# Patient Record
Sex: Female | Born: 1950 | Race: White | Hispanic: No | Marital: Married | Smoking: Former smoker
Health system: Southern US, Community
[De-identification: ages and names within clinical notes are randomized; demographics above are authoritative.]

---

## 2000-01-11 ENCOUNTER — Other Ambulatory Visit: Admission: RE | Admit: 2000-01-11 | Discharge: 2000-01-11 | Payer: Self-pay | Admitting: Family Medicine

## 2000-01-17 ENCOUNTER — Encounter: Payer: Self-pay | Admitting: Family Medicine

## 2000-01-17 ENCOUNTER — Encounter: Admission: RE | Admit: 2000-01-17 | Discharge: 2000-01-17 | Payer: Self-pay | Admitting: Family Medicine

## 2001-04-24 ENCOUNTER — Other Ambulatory Visit: Admission: RE | Admit: 2001-04-24 | Discharge: 2001-04-24 | Payer: Self-pay | Admitting: Family Medicine

## 2003-05-05 ENCOUNTER — Other Ambulatory Visit: Admission: RE | Admit: 2003-05-05 | Discharge: 2003-05-05 | Payer: Self-pay | Admitting: Family Medicine

## 2003-07-25 ENCOUNTER — Ambulatory Visit (HOSPITAL_COMMUNITY): Admission: RE | Admit: 2003-07-25 | Discharge: 2003-07-25 | Payer: Self-pay | Admitting: *Deleted

## 2003-07-25 ENCOUNTER — Encounter (INDEPENDENT_AMBULATORY_CARE_PROVIDER_SITE_OTHER): Payer: Self-pay | Admitting: *Deleted

## 2004-05-17 ENCOUNTER — Other Ambulatory Visit: Admission: RE | Admit: 2004-05-17 | Discharge: 2004-05-17 | Payer: Self-pay | Admitting: Family Medicine

## 2005-02-27 ENCOUNTER — Encounter: Admission: RE | Admit: 2005-02-27 | Discharge: 2005-02-27 | Payer: Self-pay | Admitting: Family Medicine

## 2005-05-20 ENCOUNTER — Other Ambulatory Visit: Admission: RE | Admit: 2005-05-20 | Discharge: 2005-05-20 | Payer: Self-pay | Admitting: Family Medicine

## 2006-07-31 ENCOUNTER — Other Ambulatory Visit: Admission: RE | Admit: 2006-07-31 | Discharge: 2006-07-31 | Payer: Self-pay | Admitting: Family Medicine

## 2008-06-22 ENCOUNTER — Other Ambulatory Visit: Admission: RE | Admit: 2008-06-22 | Discharge: 2008-06-22 | Payer: Self-pay | Admitting: Family Medicine

## 2009-07-18 ENCOUNTER — Other Ambulatory Visit: Admission: RE | Admit: 2009-07-18 | Discharge: 2009-07-18 | Payer: Self-pay | Admitting: Family Medicine

## 2012-08-31 ENCOUNTER — Other Ambulatory Visit: Payer: Self-pay | Admitting: Family Medicine

## 2012-08-31 ENCOUNTER — Other Ambulatory Visit (HOSPITAL_COMMUNITY)
Admission: RE | Admit: 2012-08-31 | Discharge: 2012-08-31 | Disposition: A | Discharge status: Home / Self Care | Payer: PRIVATE HEALTH INSURANCE | Source: Ambulatory Visit | Attending: Family Medicine | Admitting: Family Medicine

## 2012-08-31 DIAGNOSIS — Z124 Encounter for screening for malignant neoplasm of cervix: Secondary | ICD-10-CM | POA: Insufficient documentation

## 2012-08-31 DIAGNOSIS — Z1151 Encounter for screening for human papillomavirus (HPV): Secondary | ICD-10-CM | POA: Insufficient documentation

## 2013-09-08 ENCOUNTER — Other Ambulatory Visit: Payer: Self-pay | Admitting: Family Medicine

## 2013-09-08 DIAGNOSIS — R109 Unspecified abdominal pain: Secondary | ICD-10-CM

## 2013-09-16 ENCOUNTER — Ambulatory Visit
Admission: RE | Admit: 2013-09-16 | Discharge: 2013-09-16 | Disposition: A | Discharge status: Home / Self Care | Payer: PRIVATE HEALTH INSURANCE | Source: Ambulatory Visit | Attending: Family Medicine | Admitting: Family Medicine

## 2013-09-16 DIAGNOSIS — R109 Unspecified abdominal pain: Secondary | ICD-10-CM

## 2016-01-10 ENCOUNTER — Other Ambulatory Visit: Payer: Self-pay | Admitting: Family Medicine

## 2016-01-10 ENCOUNTER — Ambulatory Visit
Admission: RE | Admit: 2016-01-10 | Discharge: 2016-01-10 | Disposition: A | Discharge status: Home / Self Care | Payer: PRIVATE HEALTH INSURANCE | Source: Ambulatory Visit | Attending: Family Medicine | Admitting: Family Medicine

## 2016-01-10 DIAGNOSIS — M25551 Pain in right hip: Secondary | ICD-10-CM

## 2016-08-16 ENCOUNTER — Encounter: Payer: Self-pay | Admitting: Podiatry

## 2016-08-16 ENCOUNTER — Ambulatory Visit (INDEPENDENT_AMBULATORY_CARE_PROVIDER_SITE_OTHER): Payer: PRIVATE HEALTH INSURANCE | Admitting: Podiatry

## 2016-08-16 VITALS — BP 152/78 | HR 49 | Resp 16 | Ht 63.0 in | Wt 141.0 lb

## 2016-08-16 DIAGNOSIS — M216X9 Other acquired deformities of unspecified foot: Secondary | ICD-10-CM | POA: Diagnosis not present

## 2016-08-16 DIAGNOSIS — L84 Corns and callosities: Secondary | ICD-10-CM | POA: Diagnosis not present

## 2016-08-16 NOTE — Progress Notes (Signed)
   Subjective:    Patient ID: Pamela Nixon, female    DOB: 01/28/1951, 66 y.o.   MRN: [id number]  HPI Chief Complaint  Patient presents with  . Painful Lesion    Right Foot, plantar forefoot-below 5th toe; pt stated, "Has had pain since October 2017"      Review of Systems  Musculoskeletal: Positive for arthralgias.  All other systems reviewed and are negative.      Objective:   Physical Exam        Assessment & Plan:

## 2016-08-17 NOTE — Progress Notes (Signed)
Subjective:     Patient ID: Pamela BostonJudith A Nixon, female   DOB: 05/25/1951, 66 y.o.   MRN: [id number]  HPI patient presents with painful lesion right foot underneath the fourth and fifth metatarsal and states she does not remember injury   Review of Systems  All other systems reviewed and are negative.      Objective:   Physical Exam  Constitutional: She is oriented to person, place, and time.  Cardiovascular: Intact distal pulses.   Musculoskeletal: Normal range of motion.  Neurological: She is oriented to person, place, and time.  Skin: Skin is warm.  Nursing note and vitals reviewed.  neurovascular status intact muscle strength adequate range of motion with patient found to have keratotic lesion sub-fourth metatarsal right that upon debridement shows a lucent core. It is painful when pressed with direct palpation and patient's noted to have good digital perfusion well oriented 3     Assessment:     Inflammatory keratotic lesion right with porokeratotic base    Plan:     H&P condition reviewed debridement accomplished with no iatrogenic bleeding and reappoint when symptomatically

## 2018-01-02 ENCOUNTER — Other Ambulatory Visit (HOSPITAL_COMMUNITY)
Admission: RE | Admit: 2018-01-02 | Discharge: 2018-01-02 | Disposition: A | Discharge status: Home / Self Care | Payer: Commercial Managed Care - PPO | Source: Ambulatory Visit | Attending: Family Medicine | Admitting: Family Medicine

## 2018-01-02 ENCOUNTER — Other Ambulatory Visit: Payer: Self-pay | Admitting: Family Medicine

## 2018-01-02 DIAGNOSIS — Z124 Encounter for screening for malignant neoplasm of cervix: Secondary | ICD-10-CM | POA: Diagnosis not present

## 2018-01-06 LAB — CYTOLOGY - PAP
DIAGNOSIS: NEGATIVE
HPV: NOT DETECTED

## 2019-03-01 ENCOUNTER — Other Ambulatory Visit: Payer: Self-pay

## 2019-03-01 DIAGNOSIS — Z20822 Contact with and (suspected) exposure to covid-19: Secondary | ICD-10-CM

## 2019-03-03 LAB — NOVEL CORONAVIRUS, NAA: SARS-CoV-2, NAA: NOT DETECTED

## 2019-04-16 ENCOUNTER — Other Ambulatory Visit: Payer: Self-pay

## 2019-04-16 DIAGNOSIS — Z20822 Contact with and (suspected) exposure to covid-19: Secondary | ICD-10-CM

## 2019-04-17 LAB — NOVEL CORONAVIRUS, NAA: SARS-CoV-2, NAA: NOT DETECTED

## 2019-07-30 ENCOUNTER — Other Ambulatory Visit: Payer: PRIVATE HEALTH INSURANCE

## 2019-08-06 ENCOUNTER — Ambulatory Visit: Payer: PRIVATE HEALTH INSURANCE | Attending: Internal Medicine

## 2019-08-06 DIAGNOSIS — Z23 Encounter for immunization: Secondary | ICD-10-CM | POA: Insufficient documentation

## 2019-08-06 NOTE — Progress Notes (Signed)
   Covid-19 Vaccination Clinic  Name:  TASHIMA SCARPULLA    MRN: [id number] DOB: 1950/08/01  08/06/2019  Ms. Simerly was observed post Covid-19 immunization for 15 minutes without incidence. She was provided with Vaccine Information Sheet and instruction to access the V-Safe system.   Ms. Krupka was instructed to call 911 with any severe reactions post vaccine: Marland Kitchen Difficulty breathing  . Swelling of your face and throat  . A fast heartbeat  . A bad rash all over your body  . Dizziness and weakness    Immunizations Administered    Name Date Dose VIS Date Route   Pfizer COVID-19 Vaccine 08/06/2019  4:33 PM 0.3 mL 06/25/2019 Intramuscular   Manufacturer: [employer], [employer]   Lot: [id number]   NDC: [address]-1

## 2019-08-27 ENCOUNTER — Other Ambulatory Visit: Payer: Self-pay

## 2019-08-27 ENCOUNTER — Ambulatory Visit: Payer: PRIVATE HEALTH INSURANCE | Attending: Internal Medicine

## 2019-08-27 DIAGNOSIS — Z23 Encounter for immunization: Secondary | ICD-10-CM

## 2019-08-27 NOTE — Progress Notes (Signed)
   Covid-19 Vaccination Clinic  Name:  Pamela Nixon    MRN: [id number] DOB: [birth year]  08/27/2019  Pamela Nixon was observed post Covid-19 immunization for 30 minutes based on pre-vaccination screening without incidence. She was provided with Vaccine Information Sheet and instruction to access the V-Safe system.   Pamela Nixon was instructed to call 911 with any severe reactions post vaccine: Marland Kitchen Difficulty breathing  . Swelling of your face and throat  . A fast heartbeat  . A bad rash all over your body  . Dizziness and weakness    Immunizations Administered    Name Date Dose VIS Date Route   Pfizer COVID-19 Vaccine 08/27/2019  3:18 PM 0.3 mL 06/25/2019 Intramuscular   Manufacturer: [employer], [employer]   Lot: [id number]   NDC: [address]-2

## 2019-09-13 ENCOUNTER — Ambulatory Visit: Payer: PRIVATE HEALTH INSURANCE | Attending: Internal Medicine

## 2019-09-13 DIAGNOSIS — Z20822 Contact with and (suspected) exposure to covid-19: Secondary | ICD-10-CM

## 2019-09-14 LAB — NOVEL CORONAVIRUS, NAA: SARS-CoV-2, NAA: NOT DETECTED

## 2021-02-01 ENCOUNTER — Other Ambulatory Visit: Payer: Self-pay | Admitting: Family Medicine

## 2021-02-01 DIAGNOSIS — R1031 Right lower quadrant pain: Secondary | ICD-10-CM

## 2021-02-22 ENCOUNTER — Ambulatory Visit
Admission: RE | Admit: 2021-02-22 | Discharge: 2021-02-22 | Disposition: A | Discharge status: Home / Self Care | Payer: Medicare Other | Source: Ambulatory Visit | Attending: Family Medicine | Admitting: Family Medicine

## 2021-02-22 DIAGNOSIS — R1031 Right lower quadrant pain: Secondary | ICD-10-CM

## 2021-02-22 MED ORDER — IOPAMIDOL (ISOVUE-370) INJECTION 76%
80.0000 mL | Freq: Once | INTRAVENOUS | Status: AC | PRN
  Administered 2021-02-22: 80 mL via INTRAVENOUS

## 2021-08-29 ENCOUNTER — Encounter: Payer: Self-pay | Admitting: Internal Medicine

## 2021-08-29 ENCOUNTER — Ambulatory Visit (INDEPENDENT_AMBULATORY_CARE_PROVIDER_SITE_OTHER): Payer: Medicare Other | Admitting: Internal Medicine

## 2021-08-29 ENCOUNTER — Other Ambulatory Visit: Payer: Self-pay

## 2021-08-29 VITALS — BP 162/90 | HR 50 | Wt 145.8 lb

## 2021-08-29 DIAGNOSIS — R079 Chest pain, unspecified: Secondary | ICD-10-CM

## 2021-08-29 NOTE — Progress Notes (Signed)
Cardiology Office Note   Date:  08/29/2021   ID:  Pamela Nixon, DOB [birth year], MRN [id number]  PCP:  Lupita Raider, MD  Cardiologist:   Dietrich Pates, MD   Patient self referred for evaluation of chest pressure    History of Present Illness: Pamela Nixon is a 71 y.o. female with no prior cardiac Hx   Presents for evaluation of chest pressure    The pt says chest pressure is not associated with activity  First occurred on Jan 27   Wok up in AM with it   Striking  but not too severe  Pain started in mid line   Spread to both sides, between Shoulder blades    No antirior chest pain   Spells last about 10 to 15 min   The following Saturday it happened again.    Decided against going to hospital    not positional   Lasted 20 to 30 min   Gone all day Sunday   ? Angina     Monday 1/30   Morning flight    Felt OK The pt says that with activities she feels fine  No CP or back pain   She has been very busy watching grand children No pain with walking or going up/down stairs   No presyncope or syncope   Wed 8th symptoms of cold   ? Grandkids    Got cold Cold improved  Yesterday  138/78       13 0/      Current Meds  Medication Sig   doxylamine, Sleep, (UNISOM) 25 MG tablet Take 25 mg by mouth at bedtime as needed.     Allergies:   Patient has no known allergies.   No past medical history on file.  No past surgical history on file.   Social History:  The patient  reports that she has quit smoking. She has never used smokeless tobacco.   Family History:  The patient's family history is not on file.  Dad:   64 had CABG    Had 2     Paternal dad died of MI at 15    Paternal uncles  not as bad Mom:   BP , afib     Artiritis   93     4 bros  BP meds     2 sisters   BP meds   ROS:  Please see the history of present illness. All other systems are reviewed and  Negative to the above problem except as noted.    PHYSICAL EXAM: VS:  BP (!) 162/90 (BP Location: Right Arm,  Patient Position: Sitting, Cuff Size: Normal)    Pulse (!) 50    Wt 145 lb 12.8 oz (66.1 kg)    BMI 25.83 kg/m   GEN: Well nourished, well developed, in no acute distress  HEENT: normal  Neck: no JVD, carotid bruits, Cardiac: RRR; no murmurs  No LE  edema 2+ pulses  Respiratory:  clear to auscultation bilaterally, GI: soft, nontender, nondistended, + BS  No hepatomegaly  MS: no deformity Moving all extremities   Skin: warm and dry, no rash Neuro:  Strength and sensation are intact Psych: euthymic mood, full affect   EKG:  EKG is ordered today.  Sinus bradycardia   47 bpm  LVH     Lipid Panel No results found for: CHOL, TRIG, HDL, CHOLHDL, VLDL, LDLCALC, LDLDIRECT    Wt Readings from Last 3 Encounters:  08/29/21 145 lb  12.8 oz (66.1 kg)  08/16/16 141 lb (64 kg)      ASSESSMENT AND PLAN:  1  Chest / back discomfort    Very atypical for coronary ischemia/angina   ? Muscular   ? GI   (patient denies other symtpoms)   ? Pulmomary   ? Related to BP     Follow     Would get echo to evaluate LV LV anatomy     Also set up for Ca score CT to eval for coronary calcifications/plaque burden   Will also allow lungs / chest to be images   2  HTN   BP his high today   ? Due to anxiety   Check at home   Call  3  PAD  Patient has very mild atherosclerosis of distal aorta on CT   Neds tight control of liopids    Get labs  4  LVH   EKG sugg LVH   Will get echo to confirm  Get lipomed, Lpa, Apo B   Current medicines are reviewed at length with the patient today.  The patient does not have concerns regarding medicines.  Signed, Dietrich Pates, MD  08/29/2021 11:14 AM    [facility] [address] [place], [place], [place]  [address] Phone: [phone]; Fax: [phone]

## 2021-08-29 NOTE — Patient Instructions (Signed)
Medication Instructions:  Your physician recommends that you continue on your current medications as directed. Please refer to the Current Medication list given to you today.  *If you need a refill on your cardiac medications before your next appointment, please call your pharmacy*   Lab Work: LIPOMED TODAY  If you have labs (blood work) drawn today and your tests are completely normal, you will receive your results only by: MyChart Message (if you have MyChart) OR A paper copy in the mail If you have any lab test that is abnormal or we need to change your treatment, we will call you to review the results.   Testing/Procedures:  Cardiac Score   Your physician has requested that you have an echocardiogram. Echocardiography is a painless test that uses sound waves to create images of your heart. It provides your doctor with information about the size and shape of your heart and how well your hearts chambers and valves are working. This procedure takes approximately one hour. There are no restrictions for this procedure.    Follow-Up: At [facility], you and your health needs are our priority.  As part of our continuing mission to provide you with exceptional heart care, we have created designated Provider Care Teams.  These Care Teams include your primary Cardiologist (physician) and Advanced Practice Providers (APPs -  Physician Assistants and Nurse Practitioners) who all work together to provide you with the care you need, when you need it.  We recommend signing up for the patient portal called "MyChart".  Sign up information is provided on this After Visit Summary.  MyChart is used to connect with patients for Virtual Visits (Telemedicine).  Patients are able to view lab/test results, encounter notes, upcoming appointments, etc.  Non-urgent messages can be sent to your provider as well.   To learn more about what you can do with MyChart, go to [email or web address].   Coronary  Calcium Scan A coronary calcium scan is an imaging test used to look for deposits of plaque in the inner lining of the blood vessels of the heart (coronary arteries). Plaque is made up of calcium, protein, and fatty substances. These deposits of plaque can partly clog and narrow the coronary arteries without producing any symptoms or warning signs. This puts a person at risk for a heart attack. This test is recommended for people who are at moderate risk for heart disease. The test can find plaque deposits before symptoms develop. Tell a health care provider about: Any allergies you have. All medicines you are taking, including vitamins, herbs, eye drops, creams, and over-the-counter medicines. Any problems you or family members have had with anesthetic medicines. Any blood disorders you have. Any surgeries you have had. Any medical conditions you have. Whether you are pregnant or may be pregnant. What are the risks? Generally, this is a safe procedure. However, problems may occur, including: Harm to a pregnant woman and her unborn baby. This test involves the use of radiation. Radiation exposure can be dangerous to a pregnant woman and her unborn baby. If you are pregnant or think you may be pregnant, you should not have this procedure done. Slight increase in the risk of cancer. This is because of the radiation involved in the test. What happens before the procedure? Ask your health care provider for any specific instructions on how to prepare for this procedure. You may be asked to avoid products that contain caffeine, tobacco, or nicotine for 4 hours before the procedure. What happens during the  procedure?  You will undress and remove any jewelry from your neck or chest. You will put on a hospital gown. Sticky electrodes will be placed on your chest. The electrodes will be connected to an electrocardiogram (ECG) machine to record a tracing of the electrical activity of your heart. You will  lie down on a curved bed that is attached to the CT scanner. You may be given medicine to slow down your heart rate so that clear pictures can be created. You will be moved into the CT scanner, and the CT scanner will take pictures of your heart. During this time, you will be asked to lie still and hold your breath for 2-3 seconds at a time while each picture of your heart is being taken. The procedure may vary among health care providers and hospitals. What happens after the procedure? You can get dressed. You can return to your normal activities. It is up to you to get the results of your procedure. Ask your health care provider, or the department that is doing the procedure, when your results will be ready. Summary A coronary calcium scan is an imaging test used to look for deposits of plaque in the inner lining of the blood vessels of the heart (coronary arteries). Plaque is made up of calcium, protein, and fatty substances. Generally, this is a safe procedure. Tell your health care provider if you are pregnant or may be pregnant. Ask your health care provider for any specific instructions on how to prepare for this procedure. A CT scanner will take pictures of your heart. You can return to your normal activities after the scan is done. This information is not intended to replace advice given to you by your health care provider. Make sure you discuss any questions you have with your health care provider. Document Revised: 01/14/2019 Document Reviewed: 01/19/2019 Elsevier Patient Education  2022 [employer].

## 2021-08-30 LAB — APOLIPOPROTEIN B: Apolipoprotein B: 83 mg/dL (ref <90)

## 2021-08-30 LAB — NMR, LIPOPROFILE
Cholesterol, Total: 185 mg/dL (ref 100–199)
HDL Particle Number: 31.6 umol/L (ref >=30.5)
HDL-C: 74 mg/dL (ref >39)
LDL Particle Number: 928 nmol/L (ref <1000)
LDL Size: 21.2 nm (ref >20.5)
LDL-C (NIH Calc): 102 mg/dL — ABNORMAL HIGH (ref 0–99)
LP-IR Score: <25 (ref <=45)
Small LDL Particle Number: <90 nmol/L (ref <=527)
Triglycerides: 44 mg/dL (ref 0–149)

## 2021-08-30 LAB — LIPOPROTEIN A (LPA): Lipoprotein (a): 23.2 nmol/L (ref <75.0)

## 2021-09-06 ENCOUNTER — Ambulatory Visit (INDEPENDENT_AMBULATORY_CARE_PROVIDER_SITE_OTHER)
Admission: RE | Admit: 2021-09-06 | Discharge: 2021-09-06 | Disposition: A | Discharge status: Home / Self Care | Payer: Self-pay | Source: Ambulatory Visit | Attending: Internal Medicine | Admitting: Internal Medicine

## 2021-09-06 ENCOUNTER — Ambulatory Visit (HOSPITAL_COMMUNITY): Payer: Medicare Other | Attending: Cardiology

## 2021-09-06 ENCOUNTER — Other Ambulatory Visit: Payer: Self-pay

## 2021-09-06 DIAGNOSIS — R079 Chest pain, unspecified: Secondary | ICD-10-CM | POA: Insufficient documentation

## 2021-09-06 LAB — ECHOCARDIOGRAM COMPLETE
Area-P 1/2: 3.76 cm2
MV M vel: 4.99 m/s
MV Peak grad: 99.6 mmHg
S' Lateral: 2.9 cm

## 2021-09-11 ENCOUNTER — Telehealth: Payer: Self-pay

## 2021-09-11 DIAGNOSIS — M545 Low back pain, unspecified: Secondary | ICD-10-CM

## 2021-09-11 DIAGNOSIS — E7849 Other hyperlipidemia: Secondary | ICD-10-CM

## 2021-09-11 DIAGNOSIS — Z79899 Other long term (current) drug therapy: Secondary | ICD-10-CM

## 2021-09-11 DIAGNOSIS — R911 Solitary pulmonary nodule: Secondary | ICD-10-CM

## 2021-09-11 MED ORDER — TRIAMTERENE-HCTZ 37.5-25 MG PO TABS: ORAL_TABLET | ORAL | 3 refills | Status: DC

## 2021-09-11 NOTE — Telephone Encounter (Signed)
My Chart sent to the pt to follow up.... med list update, labs ordered and waiting for pt to reply with lab date.

## 2021-09-11 NOTE — Telephone Encounter (Signed)
-----   Message from Pricilla Riffle, MD sent at 09/09/2021 10:26 AM EST ----- Called pt to review test results Recomm:  Crestor 5 mg    I will call in to [facility] ([place]) 2.  Follow up 8 wks with lipomed with CMET in 8 wks   Please call her to sched 3   Will need f/u CT of chest to follow up on small nodule seen She sent in BP readings readings   Most very good  110s t o120s   Some 140, 150s    I have asked her to keep tabs on BP esp at other times of day I have called in Rx for Maxzide 37.5/25     Told her to take 1/2 as needed if BP high 150s and up     Will review needs but she will have on hand

## 2021-09-17 NOTE — Telephone Encounter (Signed)
Pt advised will order Chest Ct for the small nodule on her calcium score for one year.  ?

## 2021-09-17 NOTE — Addendum Note (Signed)
Addended by: Bertram Millard on: 09/17/2021 04:01 PM ? ? Modules accepted: Orders ? ?

## 2021-10-31 NOTE — Progress Notes (Signed)
?Terrilee Files D.O. ?[facility] ?[address] [place] [address] ?Phone: [phone] ?Subjective:   ?I, Wilford Grist, am serving as a scribe for Dr. Antoine Primas. ? ?This visit occurred during the SARS-CoV-2 public health emergency.  Safety protocols were in place, including screening questions prior to the visit, additional usage of staff PPE, and extensive cleaning of exam room while observing appropriate contact time as indicated for disinfecting solutions.  ? ? ?I'm seeing this patient by the request  of:  Lupita Raider, MD ? ?CC: Hip pain, knee pain and neck pain ? ?[id number]  ?Pamela Nixon is a 71 y.o. female coming in with complaint of thoracic spine and chest pain. This pain has resolved but she would wake up in mornings with pain. Saw cardiologist and was told that she needed further eval by ortho. History of ruptured disc at L3/L4 which first presented as pain in her hip.  ? ?Also c/o of L knee pain for past 10 years. Had scope at age 14. L hip pain over lateral aspect has also developed since knee pain has increased. Pain with stair climbing. Patient is doing some strength work which is helping her knee and hip pain. Walks 2-3 miles a day.  ?  ?Past medical history is significant for a right ovarian cyst.  This was last imaged in August 2022. ?No past medical history on file. ?No past surgical history on file. ?Social History  ? ?Socioeconomic History  ? Marital status: Married  ?  Spouse name: Not on file  ? Number of children: Not on file  ? Years of education: Not on file  ? Highest education level: Not on file  ?Occupational History  ? Not on file  ?Tobacco Use  ? Smoking status: Former  ? Smokeless tobacco: Never  ?Substance and Sexual Activity  ? Alcohol use: Not on file  ? Drug use: Not on file  ? Sexual activity: Not on file  ?Other Topics Concern  ? Not on file  ?Social History Narrative  ? Not on file  ? ?Social Determinants of Health  ? ?Financial Resource Strain: Not  on file  ?Food Insecurity: Not on file  ?Transportation Needs: Not on file  ?Physical Activity: Not on file  ?Stress: Not on file  ?Social Connections: Not on file  ? ?No Known Allergies ?No family history on file. ? ? ?Current Outpatient Medications (Cardiovascular):  ?  triamterene-hydrochlorothiazide (MAXZIDE-25) 37.5-25 MG tablet, Take one tablet as needed for BP 150 or higher. ? ? ? ? ?Current Outpatient Medications (Other):  ?  doxylamine, Sleep, (UNISOM) 25 MG tablet, Take 25 mg by mouth at bedtime as needed. ? ? ?Reviewed prior external information including notes and imaging from  ?primary care provider ?As well as notes that were available from care everywhere and other healthcare systems. ? ?Past medical history, social, surgical and family history all reviewed in electronic medical record.  No pertanent information unless stated regarding to the chief complaint.  ? ?Review of Systems: ? No headache, visual changes, nausea, vomiting, diarrhea, constipation, dizziness, abdominal pain, skin rash, fevers, chills, night sweats, weight loss, swollen lymph nodes, body aches, joint swelling, chest pain, shortness of breath, mood changes. POSITIVE muscle aches ? ?Objective  ?Blood pressure 122/70, pulse 67, height 5\' 3"  (1.6 m), weight 140 lb (63.5 kg), SpO2 98 %. ?  ?General: No apparent distress alert and oriented x3 mood and affect normal, dressed appropriately.  ?HEENT: Pupils equal, extraocular movements intact  ?  Respiratory: Patient's speak in full sentences and does not appear short of breath  ?Cardiovascular: No lower extremity edema, non tender, no erythema  ?Gait normal with good balance and coordination.  ?MSK: Left knee exam does show the patient has tenderness to palpation mostly over the medial joint line.  Mild varus deformity with some mild tibial rotation noted.  Patient's hip does have some very mild limitation with internal rotation but does have full external rotation.  Mild tenderness noted  over the greater trochanteric area.  Low back exam does have some loss of lordosis negative straight leg test though noted.  Neurovascular intact distally.   ? ?97110; 15 additional minutes spent for Therapeutic exercises as stated in above notes.  This included exercises focusing on stretching, strengthening, with significant focus on eccentric aspects.   Long term goals include an improvement in range of motion, strength, endurance as well as avoiding reinjury. Patient's frequency would include in 1-2 times a day, 3-5 times a week for a duration of 6-12 weeks. Reviewed anatomy using anatomical model and how PFS occurs. ? ?Given rehab exercises handout for VMO, hip abductors, core, entire kinetic chain including proprioception exercises. ? ?Could benefit from PT, regular exercise, upright biking, and a PFS knee brace to assist with tracking abnormalities. ? Proper technique shown and discussed handout in great detail with ATC.  All questions were discussed and answered.  ? ? ?  ?Impression and Recommendations:  ?  ? ?The above documentation has been reviewed and is accurate and complete Judi Saa, DO ? ? ? ?

## 2021-11-01 ENCOUNTER — Ambulatory Visit (INDEPENDENT_AMBULATORY_CARE_PROVIDER_SITE_OTHER): Payer: Medicare Other

## 2021-11-01 ENCOUNTER — Ambulatory Visit (INDEPENDENT_AMBULATORY_CARE_PROVIDER_SITE_OTHER): Payer: Medicare Other | Admitting: Family Medicine

## 2021-11-01 ENCOUNTER — Encounter: Payer: Self-pay | Admitting: Family Medicine

## 2021-11-01 VITALS — BP 122/70 | HR 67 | Ht 63.0 in | Wt 140.0 lb

## 2021-11-01 DIAGNOSIS — G8929 Other chronic pain: Secondary | ICD-10-CM | POA: Diagnosis not present

## 2021-11-01 DIAGNOSIS — M1712 Unilateral primary osteoarthritis, left knee: Secondary | ICD-10-CM | POA: Diagnosis not present

## 2021-11-01 DIAGNOSIS — M25562 Pain in left knee: Secondary | ICD-10-CM

## 2021-11-01 DIAGNOSIS — M545 Low back pain, unspecified: Secondary | ICD-10-CM

## 2021-11-01 DIAGNOSIS — M25552 Pain in left hip: Secondary | ICD-10-CM

## 2021-11-01 NOTE — Patient Instructions (Addendum)
Vit D 2000IU daily ?Tart cherry extract 1200mg  at night ?Exercises ?Ice 20 min  ?Visco and PRP are options ?See me in 6-8 weeks ? ? ? ?

## 2021-11-01 NOTE — Assessment & Plan Note (Signed)
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  These were all seen on x-rays today.  I believe though the patient is having more secondary to the knee pain and the arthritic changes seem to be worse tear with radicular potential symptoms of the lumbar spine also within the differential.  At this point we will give patient exercises to see if we can help with this.  Discussed potential bracing of the knee, icing regimen and over-the-counter medications.  Patient will come back again if worsening pain consider formal physical therapy and injections ?

## 2021-11-05 ENCOUNTER — Telehealth: Payer: Self-pay

## 2021-11-05 DIAGNOSIS — E559 Vitamin D deficiency, unspecified: Secondary | ICD-10-CM

## 2021-11-05 DIAGNOSIS — Z79899 Other long term (current) drug therapy: Secondary | ICD-10-CM

## 2021-11-05 NOTE — Telephone Encounter (Signed)
-----   Message from Pricilla Riffle, MD sent at 11/03/2021  9:33 AM EDT ----- ?When patient comes in for repeat lipomed alone add Vit D.  Looks like set for CMET as well ?

## 2021-11-05 NOTE — Telephone Encounter (Signed)
Labs added.

## 2021-11-06 ENCOUNTER — Other Ambulatory Visit: Payer: Medicare Other | Admitting: *Deleted

## 2021-11-06 DIAGNOSIS — E7849 Other hyperlipidemia: Secondary | ICD-10-CM

## 2021-11-06 DIAGNOSIS — Z79899 Other long term (current) drug therapy: Secondary | ICD-10-CM

## 2021-11-06 DIAGNOSIS — E559 Vitamin D deficiency, unspecified: Secondary | ICD-10-CM

## 2021-11-08 ENCOUNTER — Telehealth: Payer: Self-pay | Admitting: Internal Medicine

## 2021-11-08 NOTE — Telephone Encounter (Signed)
Patient sent a message via patient schedule stating "I'm wondering if the labwork for cholesterol was missed when I came in on 4/25. I see results for Metabolic panel and Vitamin D levels but not the lipid panel. Could you check that? My understanding was that we would need to follow up that test 6-8 weeks after starting Crestor. ?Please advise. Thank you" Please advise.  ?

## 2021-11-08 NOTE — Telephone Encounter (Signed)
Pt aware Lipomed profile has not been resulted at this time.  Checked with our lab who states it will probably be next week before results are available.  Pt states understanding and was grateful for the call back and information.  She had no further questions at the end of the call.   ?

## 2021-11-09 LAB — COMPREHENSIVE METABOLIC PANEL
ALT: 14 IU/L (ref 0–32)
AST: 19 IU/L (ref 0–40)
Albumin/Globulin Ratio: 1.9 (ref 1.2–2.2)
Albumin: 4.4 g/dL (ref 3.8–4.8)
Alkaline Phosphatase: 57 IU/L (ref 44–121)
BUN/Creatinine Ratio: 17 (ref 12–28)
BUN: 15 mg/dL (ref 8–27)
Bilirubin Total: 0.5 mg/dL (ref 0.0–1.2)
CO2: 26 mmol/L (ref 20–29)
Calcium: 9.5 mg/dL (ref 8.7–10.3)
Chloride: 101 mmol/L (ref 96–106)
Creatinine, Ser: 0.86 mg/dL (ref 0.57–1.00)
Globulin, Total: 2.3 g/dL (ref 1.5–4.5)
Glucose: 93 mg/dL (ref 70–99)
Potassium: 4.4 mmol/L (ref 3.5–5.2)
Sodium: 139 mmol/L (ref 134–144)
Total Protein: 6.7 g/dL (ref 6.0–8.5)
eGFR: 73 mL/min/{1.73_m2} (ref >59)

## 2021-11-09 LAB — NMR, LIPOPROFILE
Cholesterol, Total: 136 mg/dL (ref 100–199)
HDL Particle Number: 31.1 umol/L (ref >=30.5)
HDL-C: 73 mg/dL (ref >39)
LDL Particle Number: 372 nmol/L (ref <1000)
LDL Size: 21 nm (ref >20.5)
LDL-C (NIH Calc): 52 mg/dL (ref 0–99)
LP-IR Score: <25 (ref <=45)
Small LDL Particle Number: <90 nmol/L (ref <=527)
Triglycerides: 49 mg/dL (ref 0–149)

## 2021-11-09 LAB — VITAMIN D 25 HYDROXY (VIT D DEFICIENCY, FRACTURES): Vit D, 25-Hydroxy: 59.9 ng/mL (ref 30.0–100.0)

## 2021-11-20 NOTE — Progress Notes (Signed)
?Pamela Nixon D.O. ?[facility] ?[address] [place] [address] ?Phone: [phone] ?Subjective:   ?I, Pamela Nixon, am serving as a scribe for Dr. Antoine Primas. ? ?This visit occurred during the SARS-CoV-2 public health emergency.  Safety protocols were in place, including screening questions prior to the visit, additional usage of staff PPE, and extensive cleaning of exam room while observing appropriate contact time as indicated for disinfecting solutions.  ? ? ?I'm seeing this patient by the request  of:  Lupita Raider, MD ? ?CC: Left hip pain, low back pain ? ?[id number]  ?11/01/2021 ?Patient does have arthritic changes noted of the left knee.  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  These were all seen on x-rays today.  I believe though the patient is having more secondary to the knee pain and the arthritic changes seem to be worse tear with radicular potential symptoms of the lumbar spine also within the differential.  At this point we will give patient exercises to see if we can help with this.  Discussed potential bracing of the knee, icing regimen and over-the-counter medications.  Patient will come back again if worsening pain consider formal physical therapy and injections ? ?Updated 11/21/2021 ?Pamela Nixon is a 71 y.o. female coming in with complaint of L hip pain. Pain groin and lumbar spine. Pain is worse than last visit. Started one week ago. Believes that her pain increased after going on a long hike. Pain is worse when she stands. Taking 2 Aleve daily. Using heat as well and states that she can be nauseous from the pain. Did vomit and get the chills on Sunday.  ? ?Patient also did have symptoms of UTI last weekend and went into UC and was given cipro.  ? ? ?  ? ?No past medical history on file. ?No past surgical history on file. ?Social History  ? ?Socioeconomic History  ? Marital status:  Married  ?  Spouse name: Not on file  ? Number of children: Not on file  ? Years of education: Not on file  ? Highest education level: Not on file  ?Occupational History  ? Not on file  ?Tobacco Use  ? Smoking status: Former  ? Smokeless tobacco: Never  ?Substance and Sexual Activity  ? Alcohol use: Not on file  ? Drug use: Not on file  ? Sexual activity: Not on file  ?Other Topics Concern  ? Not on file  ?Social History Narrative  ? Not on file  ? ?Social Determinants of Health  ? ?Financial Resource Strain: Not on file  ?Food Insecurity: Not on file  ?Transportation Needs: Not on file  ?Physical Activity: Not on file  ?Stress: Not on file  ?Social Connections: Not on file  ? ?No Known Allergies ?No family history on file. ? ? ?Current Outpatient Medications (Cardiovascular):  ?  triamterene-hydrochlorothiazide (MAXZIDE-25) 37.5-25 MG tablet, Take one tablet as needed for BP 150 or higher. ? ? ? ? ?Current Outpatient Medications (Other):  ?  doxylamine, Sleep, (UNISOM) 25 MG tablet, Take 25 mg by mouth at bedtime as needed. ? ? ?Reviewed prior external information including notes and imaging from  ?primary care provider ?As well as notes that were available from care everywhere and other healthcare systems. ? ?Past medical history, social, surgical and family history all reviewed in electronic medical record.  No pertanent information unless stated regarding to the  chief complaint.  ? ?Review of Systems: ? No headache, visual changes,   diarrhea,  dizziness, abdominal pain, skin rash, fevers, chills, night sweats, weight loss, swollen lymph nodes, joint swelling, chest pain, shortness of breath, mood changes. POSITIVE muscle aches, body aches, nausea, constipation ? ?Objective  ?Blood pressure (!) 138/102, pulse 67, height 5\' 3"  (1.6 m), weight 138 lb (62.6 kg), SpO2 99 %. ?  ?General: No apparent distress alert and oriented x3 mood and affect normal, dressed appropriately.  ?HEENT: Pupils equal, extraocular  movements intact  ?Respiratory: Patient's speak in full sentences and does not appear short of breath  ?Cardiovascular: No lower extremity edema, non tender, no erythema  ?Gait antalgic favoring the left side of her back and hip ?Tender to palpation of the paraspinal musculature.  Patient does have more pain with resisted hip flexion.  Patient is tender to palpation in the gluteal area and on the back as well. ?Stomach exam shows some very mild discomfort in the left lower quadrant.  No masses appreciated.  Negative Valsalva. ?  ?Impression and Recommendations:  ?  ? ?The above documentation has been reviewed and is accurate and complete , DO ? ? ? ?

## 2021-11-21 ENCOUNTER — Encounter: Payer: Self-pay | Admitting: Family Medicine

## 2021-11-21 ENCOUNTER — Ambulatory Visit (INDEPENDENT_AMBULATORY_CARE_PROVIDER_SITE_OTHER): Payer: Medicare Other | Admitting: Family Medicine

## 2021-11-21 ENCOUNTER — Ambulatory Visit: Payer: Self-pay

## 2021-11-21 ENCOUNTER — Ambulatory Visit (INDEPENDENT_AMBULATORY_CARE_PROVIDER_SITE_OTHER): Payer: Medicare Other

## 2021-11-21 VITALS — BP 138/102 | HR 67 | Ht 63.0 in | Wt 138.0 lb

## 2021-11-21 DIAGNOSIS — M2550 Pain in unspecified joint: Secondary | ICD-10-CM | POA: Diagnosis not present

## 2021-11-21 DIAGNOSIS — R109 Unspecified abdominal pain: Secondary | ICD-10-CM

## 2021-11-21 DIAGNOSIS — M25552 Pain in left hip: Secondary | ICD-10-CM | POA: Insufficient documentation

## 2021-11-21 DIAGNOSIS — R1030 Lower abdominal pain, unspecified: Secondary | ICD-10-CM | POA: Diagnosis not present

## 2021-11-21 DIAGNOSIS — M25551 Pain in right hip: Secondary | ICD-10-CM | POA: Diagnosis not present

## 2021-11-21 LAB — CBC WITH DIFFERENTIAL/PLATELET
Basophils Absolute: 0.1 10*3/uL (ref 0.0–0.1)
Basophils Relative: 0.7 % (ref 0.0–3.0)
Eosinophils Absolute: 0.1 10*3/uL (ref 0.0–0.7)
Eosinophils Relative: 0.8 % (ref 0.0–5.0)
HCT: 34.4 % — ABNORMAL LOW (ref 36.0–46.0)
Hemoglobin: 11.4 g/dL — ABNORMAL LOW (ref 12.0–15.0)
Lymphocytes Relative: 25.8 % (ref 12.0–46.0)
Lymphs Abs: 2.3 10*3/uL (ref 0.7–4.0)
MCHC: 33.1 g/dL (ref 30.0–36.0)
MCV: 86.4 fl (ref 78.0–100.0)
Monocytes Absolute: 0.7 10*3/uL (ref 0.1–1.0)
Monocytes Relative: 8.2 % (ref 3.0–12.0)
Neutro Abs: 5.8 10*3/uL (ref 1.4–7.7)
Neutrophils Relative %: 64.5 % (ref 43.0–77.0)
Platelets: 312 10*3/uL (ref 150.0–400.0)
RBC: 3.98 Mil/uL (ref 3.87–5.11)
RDW: 15.2 % (ref 11.5–15.5)
WBC: 9 10*3/uL (ref 4.0–10.5)

## 2021-11-21 LAB — URINALYSIS, ROUTINE W REFLEX MICROSCOPIC
Bilirubin Urine: NEGATIVE
Hgb urine dipstick: NEGATIVE
Ketones, ur: NEGATIVE
Nitrite: NEGATIVE
RBC / HPF: NONE SEEN (ref 0–?)
Specific Gravity, Urine: 1.01 (ref 1.000–1.030)
Total Protein, Urine: NEGATIVE
Urine Glucose: NEGATIVE
Urobilinogen, UA: 0.2 (ref 0.0–1.0)
pH: 5.5 (ref 5.0–8.0)

## 2021-11-21 LAB — COMPREHENSIVE METABOLIC PANEL
ALT: 13 U/L (ref 0–35)
AST: 17 U/L (ref 0–37)
Albumin: 4.3 g/dL (ref 3.5–5.2)
Alkaline Phosphatase: 48 U/L (ref 39–117)
BUN: 20 mg/dL (ref 6–23)
CO2: 26 mEq/L (ref 19–32)
Calcium: 9.2 mg/dL (ref 8.4–10.5)
Chloride: 102 mEq/L (ref 96–112)
Creatinine, Ser: 0.89 mg/dL (ref 0.40–1.20)
GFR: 65.99 mL/min (ref >60.00)
Glucose, Bld: 106 mg/dL — ABNORMAL HIGH (ref 70–99)
Potassium: 4.3 mEq/L (ref 3.5–5.1)
Sodium: 137 mEq/L (ref 135–145)
Total Bilirubin: 0.5 mg/dL (ref 0.2–1.2)
Total Protein: 7.2 g/dL (ref 6.0–8.3)

## 2021-11-21 MED ORDER — KETOROLAC TROMETHAMINE 30 MG/ML IJ SOLN
30.0000 mg | Freq: Once | INTRAMUSCULAR | Status: AC
  Administered 2021-11-21: 30 mg via INTRAMUSCULAR

## 2021-11-21 MED ORDER — METHYLPREDNISOLONE ACETATE 40 MG/ML IJ SUSP
40.0000 mg | Freq: Once | INTRAMUSCULAR | Status: AC
  Administered 2021-11-21: 40 mg via INTRAMUSCULAR

## 2021-11-21 NOTE — Assessment & Plan Note (Signed)
Patient is having pain that seems to be out of proportion to the amount of palpation.  Patient is having difficulty with some little constipation as well.  Patient has some pain that seems to be more muscular but difficult to tell with patient groin pain as well.  Has had some lumbar issues in the past.  Given Toradol and Depo-Medrol today.  Due to patient having increasing discomfort and recent UTI MRI of the pelvis with and without contrast ordered today.  Patient has had a ovarian cyst that has been monitored.  Patient also has had chronic constipation and will check if there is anything such as diverticulosis that could be contributing as well.  Patient has had the UTI we will also rule out things such as urinary tract infections with possible kidney stone that could be playing a role. ? ?If all is negative we will treat more as a tendinitis of the hip flexor. ?Given some home exercises and icing regimen in the interim.  Patient knows of significant worsening pain especially if associated with any more nausea or vomiting to seek medical attention immediately ?

## 2021-11-21 NOTE — Patient Instructions (Addendum)
Injections in backside ?KUB and UA today ?MRI pelvis w and wo [id number] ?See me in 4 weeks ? ?

## 2021-12-06 ENCOUNTER — Ambulatory Visit
Admission: RE | Admit: 2021-12-06 | Discharge: 2021-12-06 | Disposition: A | Discharge status: Home / Self Care | Payer: Medicare Other | Source: Ambulatory Visit | Attending: Family Medicine | Admitting: Family Medicine

## 2021-12-06 DIAGNOSIS — R1030 Lower abdominal pain, unspecified: Secondary | ICD-10-CM

## 2021-12-06 MED ORDER — GADOBENATE DIMEGLUMINE 529 MG/ML IV SOLN
12.0000 mL | Freq: Once | INTRAVENOUS | Status: AC | PRN
  Administered 2021-12-06: 12 mL via INTRAVENOUS

## 2021-12-19 NOTE — Progress Notes (Signed)
[facility] [address] [place] [address] Phone: [phone] Subjective:   Bruce Donath, am serving as a scribe for Dr. Antoine Primas.   I'm seeing this patient by the request  of:  Lupita Raider, MD  CC: Knee pain, hip pain and back pain  CVE:[id number]  11/21/2021 Patient is having pain that seems to be out of proportion to the amount of palpation.  Patient is having difficulty with some little constipation as well.  Patient has some pain that seems to be more muscular but difficult to tell with patient groin pain as well.  Has had some lumbar issues in the past.  Given Toradol and Depo-Medrol today.  Due to patient having increasing discomfort and recent UTI MRI of the pelvis with and without contrast ordered today.  Patient has had a ovarian cyst that has been monitored.  Patient also has had chronic constipation and will check if there is anything such as diverticulosis that could be contributing as well.  Patient has had the UTI we will also rule out things such as urinary tract infections with possible kidney stone that could be playing a role.   If all is negative we will treat more as a tendinitis of the hip flexor. Given some home exercises and icing regimen in the interim.  Patient knows of significant worsening pain especially if associated with any more nausea or vomiting to seek medical attention immediately  Update 12/20/2021 NAKOTA ACKERT is a 71 y.o. female coming in with complaint of L hip pain. Patient states that her pain ahd dissipated.   MRI pelvis 12/06/2021 IMPRESSION: 1.  No evidence of stress fracture.   2. No evidence of sacroiliitis or appreciable osseous lesion. No periosteal reaction.   3. Mild bilateral hip osteoarthritis. No evidence of joint effusion.   4.  Muscles and soft tissues of the pelvis are within normal limits.   5. Cystic structure at the level of S2 measuring up to 2.8 cm, likely adnexal cysts,  unchanged.     No past medical history on file. No past surgical history on file. Social History   Socioeconomic History   Marital status: Married    Spouse name: Not on file   Number of children: Not on file   Years of education: Not on file   Highest education level: Not on file  Occupational History   Not on file  Tobacco Use   Smoking status: Former   Smokeless tobacco: Never  Substance and Sexual Activity   Alcohol use: Not on file   Drug use: Not on file   Sexual activity: Not on file  Other Topics Concern   Not on file  Social History Narrative   Not on file   Social Determinants of Health   Financial Resource Strain: Not on file  Food Insecurity: Not on file  Transportation Needs: Not on file  Physical Activity: Not on file  Stress: Not on file  Social Connections: Not on file   No Known Allergies No family history on file.   Current Outpatient Medications (Cardiovascular):    triamterene-hydrochlorothiazide (MAXZIDE-25) 37.5-25 MG tablet, Take one tablet as needed for BP 150 or higher.     Current Outpatient Medications (Other):    doxylamine, Sleep, (UNISOM) 25 MG tablet, Take 25 mg by mouth at bedtime as needed.    Review of Systems:  No headache, visual changes, nausea, vomiting, diarrhea, constipation, dizziness, abdominal pain, skin rash, fevers, chills, night  sweats, weight loss, swollen lymph nodes, body aches, joint swelling, chest pain, shortness of breath, mood changes. POSITIVE muscle aches but improved  Objective  Blood pressure (!) 162/92, pulse (!) 56, height 5\' 3"  (1.6 m), SpO2 98 %.   General: No apparent distress alert and oriented x3 mood and affect normal, dressed appropriately.  HEENT: Pupils equal, extraocular movements intact  Respiratory: Patient's speak in full sentences and does not appear short of breath  Cardiovascular: No lower extremity edema, non tender, no erythema  Gait normal with good balance and coordination.   MSK: Back exam does have some degenerative scoliosis noted.  Negative straight leg test.  The patient seems to be extremely more comfortable than previous exam. Very mild scoliosis in the thoracolumbar junction with sitting noted. Left knee does have some arthritic changes noted.  Mild crepitus with flexion and extension.  No significant instability noted   Impression and Recommendations:     The above documentation has been reviewed and is accurate and complete , DO

## 2021-12-20 ENCOUNTER — Ambulatory Visit (INDEPENDENT_AMBULATORY_CARE_PROVIDER_SITE_OTHER): Payer: Medicare Other | Admitting: Family Medicine

## 2021-12-20 ENCOUNTER — Ambulatory Visit: Payer: Medicare Other

## 2021-12-20 VITALS — BP 162/92 | HR 56 | Ht 63.0 in

## 2021-12-20 DIAGNOSIS — M1712 Unilateral primary osteoarthritis, left knee: Secondary | ICD-10-CM | POA: Diagnosis not present

## 2021-12-20 DIAGNOSIS — M4150 Other secondary scoliosis, site unspecified: Secondary | ICD-10-CM

## 2021-12-20 DIAGNOSIS — M545 Low back pain, unspecified: Secondary | ICD-10-CM | POA: Diagnosis not present

## 2021-12-20 DIAGNOSIS — M25552 Pain in left hip: Secondary | ICD-10-CM | POA: Diagnosis not present

## 2021-12-20 NOTE — Assessment & Plan Note (Addendum)
Degenerative scoliosis noted.  Discussed icing regimen and home exercises.Discussed which activities to do .  Sent to PT due to concerns over family history of degenerative scoliosis.  Encouraged vitamin D supplementation. Last vitamin D Lab Results  Component Value Date   VD25OH 59.9 11/06/2021

## 2021-12-20 NOTE — Assessment & Plan Note (Signed)
Arthritis of left knee.  Discussed different things such as the possibility of injections.  At this moment patient would like to hold off and see how she does.  Patient will follow-up with me again in 3 to 6 months if needed.  We will do formal physical therapy for the knee but also more for the lower back and balance and coordination.

## 2021-12-20 NOTE — Patient Instructions (Signed)
PT Drawbridge-lumbar strengthening You have all the tools  Write [place] if you need [place] See me if you need me

## 2022-01-22 ENCOUNTER — Encounter (HOSPITAL_BASED_OUTPATIENT_CLINIC_OR_DEPARTMENT_OTHER): Payer: Self-pay | Admitting: Physical Therapy

## 2022-01-22 ENCOUNTER — Ambulatory Visit (HOSPITAL_BASED_OUTPATIENT_CLINIC_OR_DEPARTMENT_OTHER): Payer: Medicare Other | Attending: Family Medicine | Admitting: Physical Therapy

## 2022-01-22 DIAGNOSIS — M6281 Muscle weakness (generalized): Secondary | ICD-10-CM

## 2022-01-22 DIAGNOSIS — M25552 Pain in left hip: Secondary | ICD-10-CM

## 2022-01-22 DIAGNOSIS — M5459 Other low back pain: Secondary | ICD-10-CM

## 2022-01-22 DIAGNOSIS — M25551 Pain in right hip: Secondary | ICD-10-CM | POA: Diagnosis present

## 2022-01-22 NOTE — Therapy (Incomplete)
OUTPATIENT PHYSICAL THERAPY THORACOLUMBAR EVALUATION   Patient Name: Pamela Nixon MRN: [id number] DOB:08-16-1950, 71 y.o., female Today's Date: 01/23/2022   PT End of Session - 01/22/22 1222     Visit Number 1    Number of Visits 4    Date for PT Re-Evaluation 02/19/22    Authorization Type MCR A and B    PT Start Time 1110    PT Stop Time 1209    PT Time Calculation (min) 59 min    Activity Tolerance Patient tolerated treatment well    Behavior During Therapy [facility] for tasks assessed/performed             History reviewed. No pertinent past medical history. History reviewed. No pertinent surgical history. Patient Active Problem List   Diagnosis Date Noted   Degenerative scoliosis 12/20/2021   Hip pain, left 11/21/2021   Arthritis of left knee 11/01/2021    REFERRING PROVIDER: Judi Saa, DO   REFERRING DIAG: [phone] (ICD-10-CM) - Left hip pain M54.50 (ICD-10-CM) - Lumbar spine pain   Rationale for Evaluation and Treatment Rehabilitation  THERAPY DIAG:  Pain in left hip  Other low back pain  Muscle weakness (generalized)  Pain in right hip  ONSET DATE: April 2023  SUBJECTIVE:                                                                                                                                                                                           SUBJECTIVE STATEMENT: Pt reports she had a ruptured disc in 2017 and had a discectomy at L3-4.  Pt reports having pain in thoracic region which wrapped around to her chest in February.  Pt saw MD who referred pt to cardiologist.  Pt saw cardiologist and states she did have some plaque build up.  Cardiologist didn't think pain was cardiac related.  Pt was referred to Dr. Antoine Primas.  Pt saw Dr. Katrinka Blazing and received exercises.  He ordered an x ray and MRI.  Pt had an UTI and was negative for kidney stones.  Pt states MD thought pain may be from muscle damage in anterior hip/groin.   MD note indicated pt has  degenerative scoliosis, very mild scoliosis in the thoracolumbar junction.  Pt denies having any thoracic pain currently.  Pt reports she went on a long hike in April and had increased L hip and groin pain.  She states her back doesn't really bother her.  She has occasional lumbar pain.  Pt received a Toradol and Depo-Medrol injection in May which improved sx's.  Pt states she slipped and fell on her buttocks last week when  descending stairs from a bed loft.  She thinks that may contribute to her pain today also.  Pt was unable to walk upstairs due to her L hip pain.  She states she can perform stairs fine now.  Pt ambulates 35-40 mins per day and feels better when she walks.  Pt was unable to perform walking program last week due to being out of town and she felt her pain coming back.  Pt states she is able to walk 2 miles without pain.  Pt states she danced 1x/wk (though not in summer) and has soreness after dancing.   Pt states her L knee is the worst. Pt states she has had L knee pain for 10 years.  She has arthritis in L knee.  MD note indicated formal physical therapy for the knee but also more for the lower back and balance and coordination    PERTINENT HISTORY:  L knee arthritis  PAIN:  Are you having pain? Yes NPRS:  Lumbar: 2/10 current, 2/10 worst, 0/10 best              L hip:  2/10 current, 4-5/10 worst, 0/10 best  Location: bilat lower lumbar and posterior hips today   PRECAUTIONS: Other: L knee arthritis  WEIGHT BEARING RESTRICTIONS No  FALLS:  Has patient fallen in last 6 months? Yes; 1  LIVING ENVIRONMENT: Lives with: lives with their spouse Lives in: 3 story home Stairs: yes Has following equipment at home: Single point cane and standard walker  OCCUPATION: pt is retired  PLOF: Independent; Pt was able to perform stairs and her normal functional mobility skills without back and lumbar pain.  She has L knee arthritis and has had chronic L knee pain.   PATIENT GOALS  improve core strength to protect her spine, be active   OBJECTIVE:   DIAGNOSTIC FINDINGS:  -MRI pelvis 12/06/2021 IMPRESSION: 1.  No evidence of stress fracture.   2. No evidence of sacroiliitis or appreciable osseous lesion. No periosteal reaction.   3. Mild bilateral hip osteoarthritis. No evidence of joint effusion.   4.  Muscles and soft tissues of the pelvis are within normal limits.   5. Cystic structure at the level of S2 measuring up to 2.8 cm, likely adnexal cysts, unchanged.  -Lumbar X rays:  right convex scoliosis centered at the thoracolumbar junction  IMPRESSION: 1. Progressive multilevel spondylosis and facet hypertrophy. No acute bony abnormality. 2. Right convex scoliosis.   PATIENT SURVEYS:  LEFS 72/80  SCREENING FOR RED FLAGS:  Spinal tumors: No Cauda equina syndrome: No Compression fracture: No Abdominal aneurysm: No  COGNITION:  Overall cognitive status: Within functional limits for tasks assessed       PALPATION: TTP:  Very tender to palpate at L lateral hip and ITB.  minimally tender on R ITB, none in lateral hip.   TP at L2, spot tender at R piriformis region    LUMBAR ROM:   Active  A/PROM  eval  Flexion WNL  Extension WNL  Right lateral flexion WFL  Left lateral flexion WFL  Right rotation WNL  Left rotation WNL   (Blank rows = not tested)  LOWER EXTREMITY MMT:     MMT Right eval Left eval  Hip flexion 5/5 5/5  Hip extension 5/5 4/5  Hip abduction 5/5 5/5  Hip adduction    Hip internal rotation    Hip external rotation 5/5 5/5  Knee flexion 5/5 tested in sitting 5/5 tested in sitting  Knee extension  4+/5 4+/5  Ankle dorsiflexion    Ankle plantarflexion    Ankle inversion    Ankle eversion     (Blank rows = not tested)  LOWER EXTREMITY MMT:    MMT Right eval Left eval  Hip flexion    Hip extension WNL WNL  Hip abduction WNL WNL  Hip adduction    Hip internal rotation    Hip external rotation    Knee flexion     Knee extension    Ankle dorsiflexion    Ankle plantarflexion    Ankle inversion    Ankle eversion     (Blank rows = not tested)  LUMBAR SPECIAL TESTS:  Straight leg raise test: Negative bilat   GAIT: Assistive device utilized: None Level of assistance: Complete Independence Comments: Pt ambulates with a normalized heel to toe gait without limping.     TODAY'S TREATMENT  Reviewed exercises from Dr. Katrinka Blazing and her current exercise routine.  Pt is not performing the exercises from Dr. Katrinka Blazing.  She is performing squats, lunges, bridges, q-ped birddogs, supine Y's, T's, W's, and L's.  She occasionally performs the plank and also biceps curls and kickbacks.  Educated pt in correct form with exercises and purpose of exercises.   PT instructed in STW to ITB with hand and tennis ball.  See below for pt education.    PATIENT EDUCATION:  Education details: rationale of exercises, objective findings, POC, HEP, dx, exercise form, and prognosis.  PT answered pt's questions.   Person educated: Patient Education method: Explanation, Demonstration, and Verbal cues Education comprehension: verbalized understanding, returned demonstration, and verbal cues required   HOME EXERCISE PROGRAM: Will give next visit. Pt has  current program.   ASSESSMENT:  CLINICAL IMPRESSION: Patient is a 70 y.o. female who was seen today for physical therapy evaluation and treatment for L hip pain and lumbar spine pain.  Pt is doing much better.  She reports improved sx's in May after receiving a hip injection.  She was having L hip pain which interfered with her daily mobility including stairs.  Pt states her back typically feels fine though she is having some lumbar pain today.  Pt typically has soreness with dancing though is not performing in the summer.  Pt performs a walking program and typically feels better with walking program.  Pt has a hx of L knee pain and arthritis and states her knee bothers her the most.   Pt has pain in bilat lower lumbar flanks and bilat posterior hips today.  Pt has weakness in L hip extension and minimal weakness though symmetrical in bilat quads.  She has a good home program which she consistently performs.  PT reviewed her current home program, answered questions, and educated pt in correct form.    OBJECTIVE IMPAIRMENTS decreased strength and pain.   ACTIVITY LIMITATIONS   PARTICIPATION LIMITATIONS:   PERSONAL FACTORS 1 comorbidity: L knee arthritis  are also affecting patient's functional outcome.   REHAB POTENTIAL: Good  CLINICAL DECISION MAKING: Stable/uncomplicated  EVALUATION COMPLEXITY: Low   GOALS:  SHORT TERM GOALS: Target date: 02/05/2022  Pt will tolerate core exercises without adverse effects for improved core stability and reduced risk of recurring pain with daily activities and daily mobility.  Baseline: Goal status: INITIAL  LONG TERM GOALS: Target date: 02/19/2022  Pt will demo improved L hip extension strength to 5/5 for improved performance of and tolerance with functional mobility. Baseline:  Goal status: INITIAL  2.  Pt will report continued  performance of stairs without hip or lumbar pain.  Baseline:  Goal status: INITIAL  3.  Pt will be independent with core stabilization HEP for improved core stability and reduce stress on lumbar with workout activities and functional mobility.  Baseline:  Goal status: INITIAL     PLAN: PT FREQUENCY: 1x/week-2x/wk this week and 1x/wk afterwards  PT DURATION: other: 3-4 weeks  PLANNED INTERVENTIONS: Therapeutic exercises, Therapeutic activity, Neuromuscular re-education, Patient/Family education, Joint mobilization, Stair training, Aquatic Therapy, Dry Needling, Electrical stimulation, Spinal mobilization, Cryotherapy, Moist heat, Taping, Ultrasound, Manual therapy, and Re-evaluation.  PLAN FOR NEXT SESSION: Cont with core strengthening for 2-3 visits.     Audie Clear III PT,  DPT 01/23/22 11:23 AM

## 2022-01-24 ENCOUNTER — Ambulatory Visit (HOSPITAL_BASED_OUTPATIENT_CLINIC_OR_DEPARTMENT_OTHER): Payer: Medicare Other | Admitting: Physical Therapy

## 2022-01-24 DIAGNOSIS — M25551 Pain in right hip: Secondary | ICD-10-CM

## 2022-01-24 DIAGNOSIS — M6281 Muscle weakness (generalized): Secondary | ICD-10-CM

## 2022-01-24 DIAGNOSIS — M25552 Pain in left hip: Secondary | ICD-10-CM | POA: Diagnosis not present

## 2022-01-24 DIAGNOSIS — M5459 Other low back pain: Secondary | ICD-10-CM

## 2022-01-24 NOTE — Therapy (Addendum)
OUTPATIENT PHYSICAL THERAPY TREATMENT NOTE   Patient Name: Pamela Nixon MRN: [id number] DOB:[birth year], 71 y.o., female Today's Date: 01/25/2022    END OF SESSION:   PT End of Session - 01/24/22 1155     Visit Number 2    Number of Visits 4    Date for PT Re-Evaluation 02/19/22    Authorization Type MCR A and B    PT Start Time 1105    PT Stop Time 1146    PT Time Calculation (min) 41 min    Activity Tolerance Patient tolerated treatment well    Behavior During Therapy [facility] for tasks assessed/performed             History reviewed. No pertinent past medical history. History reviewed. No pertinent surgical history. Patient Active Problem List   Diagnosis Date Noted   Degenerative scoliosis 12/20/2021   Hip pain, left 11/21/2021   Arthritis of left knee 11/01/2021    REFERRING PROVIDER: Judi Saa, DO    REFERRING DIAG: [phone] (ICD-10-CM) - Left hip pain M54.50 (ICD-10-CM) - Lumbar spine pain    Rationale for Evaluation and Treatment Rehabilitation   THERAPY DIAG:  Pain in left hip   Other low back pain   Muscle weakness (generalized)   Pain in right hip   ONSET DATE: April 2023   SUBJECTIVE:                                                                                                                                                                                            SUBJECTIVE STATEMENT: Pt denies any adverse effects after prior Rx.  Pt states she has been performing some self soft tissue work to ITB as instructed.  She is tender with the soft tissue work though tolerates it well.  Pt reports there was no clinic schedule availability next week and the following week she is on vacation.          PERTINENT HISTORY:  Lumbar discectomy L3-4 in 2017, egenerative scoliosis, very mild scoliosis in the thoracolumbar junction, L knee arthritis   PAIN:  Are you having pain? Yes NPRS:  Lumbar: 0/10 current, 2/10 worst, 0/10 best              L hip:   0/10 current, 4-5/10 worst, 0/10 best   L knee:  0/10  Location: bilat lower lumbar and posterior hips today     PRECAUTIONS: Other: L knee arthritis   WEIGHT BEARING RESTRICTIONS No   FALLS:  Has patient fallen in last 6 months? Yes; 1   LIVING ENVIRONMENT: Lives with: lives with their spouse Lives  in: 3 story home Stairs: yes Has following equipment at home: Single point cane and standard walker   OCCUPATION: pt is retired   PLOF: Independent; Pt was able to perform stairs and her normal functional mobility skills without back and lumbar pain.  She has L knee arthritis and has had chronic L knee pain.    PATIENT GOALS improve core strength to protect her spine, be active     OBJECTIVE:    DIAGNOSTIC FINDINGS:  -MRI pelvis 12/06/2021 IMPRESSION: 1.  No evidence of stress fracture.   2. No evidence of sacroiliitis or appreciable osseous lesion. No periosteal reaction.   3. Mild bilateral hip osteoarthritis. No evidence of joint effusion.   4.  Muscles and soft tissues of the pelvis are within normal limits.   5. Cystic structure at the level of S2 measuring up to 2.8 cm, likely adnexal cysts, unchanged.   -Lumbar X rays:  right convex scoliosis centered at the thoracolumbar junction  IMPRESSION: 1. Progressive multilevel spondylosis and facet hypertrophy. No acute bony abnormality. 2. Right convex scoliosis.         TODAY'S TREATMENT  PT educated pt in correct performance and palpation of TrA contraction.  Pt performed supine TrA contraction with 5 sec hold. Pt performed:  Supine alt UE/LE with TrA 2x10 reps  Supine bridge with TrA 2x10 reps  Supine bridge with clams with GTB 2x10 reps   Q-ped birddogs with TrA 2x10 reps  Squats with TrA 2x10 reps  Paloff Press with RTB with TrA 2x10 reps bilat Pt received a HEP handout and was educated in correct form and appropriate frequency.  Pt instructed she should not have pain with HEP.     PATIENT EDUCATION:   Education details: rationale of exercises, POC, HEP, dx, exercise form, and prognosis.  PT answered pt's questions.   Person educated: Patient Education method: Explanation, Demonstration, and Verbal cues, t/c's, handout Education comprehension: verbalized understanding, returned demonstration, and verbal and tactile cues required     HOME EXERCISE PROGRAM: Access Code: [email or web address] URL: [email or web address] Date: 01/24/2022 Prepared by: Aaron Edelman  Exercises - Supine Transversus Abdominis Bracing - Hands on Stomach  - 1 x daily - 7 x weekly - 2 sets - 10 reps - 5 seconds hold - Supine alternating UE/LE  - 1 x daily - 5-6 x weekly - 2 sets - 10 reps - Supine Bridge  - 1 x daily - 5 x weekly - 2 sets - 10 reps - Supine Bridge with Resistance Band  - 3 x weekly - 2 sets - 10 reps - Standing Anti-Rotation Press with Anchored Resistance  - 3-4 x weekly - 2 sets - 10 reps   ASSESSMENT:   CLINICAL IMPRESSION: PT established HEP today.  Pt was instructed in performing TrA contractions and engaging TrA with LE/core exercises to facilitate improved core stability/strength.  She performed exercises well with cuing and instruction in correct form.  PT gave pt a HEP handout and pt demonstrates good understanding.  She responded well to Rx having no pain after Rx.  She should benefit from continued skilled PT services.      OBJECTIVE IMPAIRMENTS decreased strength and pain.    ACTIVITY LIMITATIONS    PARTICIPATION LIMITATIONS:    PERSONAL FACTORS 1 comorbidity: L knee arthritis  are also affecting patient's functional outcome.    REHAB POTENTIAL: Good   CLINICAL DECISION MAKING: Stable/uncomplicated   EVALUATION COMPLEXITY: Low     GOALS:   SHORT TERM GOALS:  Target date: 02/05/2022   Pt will tolerate core exercises without adverse effects for improved core stability and reduced risk of recurring pain with daily activities and daily mobility.  Baseline: Goal status:  INITIAL   LONG TERM GOALS: Target date: 02/19/2022   Pt will demo improved L hip extension strength to 5/5 for improved performance of and tolerance with functional mobility. Baseline:  Goal status: INITIAL   2.  Pt will report continued performance of stairs without hip or lumbar pain.  Baseline:  Goal status: INITIAL   3.  Pt will be independent with core stabilization HEP for improved core stability and reduce stress on lumbar with workout activities and functional mobility.  Baseline:  Goal status: INITIAL         PLAN: PT FREQUENCY: 1x/week-2x/wk this week and 1x/wk afterwards   PT DURATION: other: 3-4 weeks   PLANNED INTERVENTIONS: Therapeutic exercises, Therapeutic activity, Neuromuscular re-education, Patient/Family education, Joint mobilization, Stair training, Aquatic Therapy, Dry Needling, Electrical stimulation, Spinal mobilization, Cryotherapy, Moist heat, Taping, Ultrasound, Manual therapy, and Re-evaluation.   PLAN FOR NEXT SESSION: Cont with core strengthening for 2-3 visits.  STW to ITB    Audie Clear III PT, DPT 01/25/22 9:52 AM  PHYSICAL THERAPY DISCHARGE SUMMARY  Visits from Start of Care: 2  Current functional level related to goals / functional outcomes: Unable to assess current functional status or goals due to pt not being present at discharge.    Remaining deficits: See above   Education / Equipment: Pt has a HEP.   Pt had difficulty with scheduling PT due to the availability of the clinic and then she was on vacation.  Pt was seen in PT on 7/11 and 01/24/22.  Her appt on 8/1 was canceled due to the therapist being sick.  She then canceled her following appt on 8/9 stating she was feeling better and was going to be out of town.  She stated the exercises are working very well and would continue with exercises.  Pt will be considered discharged at this time.     Audie Clear III PT, DPT 07/09/22 11:33 AM

## 2022-01-25 ENCOUNTER — Encounter (HOSPITAL_BASED_OUTPATIENT_CLINIC_OR_DEPARTMENT_OTHER): Payer: Self-pay | Admitting: Physical Therapy

## 2022-02-12 ENCOUNTER — Encounter (HOSPITAL_BASED_OUTPATIENT_CLINIC_OR_DEPARTMENT_OTHER): Payer: Medicare Other | Admitting: Physical Therapy

## 2022-02-12 ENCOUNTER — Encounter (HOSPITAL_BASED_OUTPATIENT_CLINIC_OR_DEPARTMENT_OTHER): Payer: Self-pay

## 2022-02-20 ENCOUNTER — Encounter (HOSPITAL_BASED_OUTPATIENT_CLINIC_OR_DEPARTMENT_OTHER): Payer: Medicare Other | Admitting: Physical Therapy

## 2022-08-06 ENCOUNTER — Telehealth: Payer: Self-pay

## 2022-08-06 DIAGNOSIS — Z79899 Other long term (current) drug therapy: Secondary | ICD-10-CM

## 2022-08-06 DIAGNOSIS — E7849 Other hyperlipidemia: Secondary | ICD-10-CM

## 2022-08-06 NOTE — Telephone Encounter (Signed)
Labs added for the pt to have prior to her CT.

## 2022-08-06 NOTE — Telephone Encounter (Signed)
-----  Message from Fay Records, MD sent at 08/05/2022  8:09 PM EST ----- Regarding: RE: labs Lipomed, CMET, CBC ----- Message ----- From: Stephani Police, RN Sent: 08/05/2022   4:57 PM EST To: Fay Records, MD; Armando Gang Subject: labs                                           Dr Harrington Challenger.. pt is having her CT for nodule in Feb and she needs bmet so not sure if she needs any other labs based on her 10/2021 labs... I did not see that they were resulted and needed anything repeated... also, when to see her back?   Lelon Frohlich

## 2022-09-03 ENCOUNTER — Ambulatory Visit: Payer: Medicare Other | Attending: Internal Medicine

## 2022-09-03 DIAGNOSIS — Z79899 Other long term (current) drug therapy: Secondary | ICD-10-CM

## 2022-09-03 DIAGNOSIS — E7849 Other hyperlipidemia: Secondary | ICD-10-CM

## 2022-09-03 LAB — CBC

## 2022-09-04 LAB — COMPREHENSIVE METABOLIC PANEL
ALT: 14 IU/L (ref 0–32)
AST: 18 IU/L (ref 0–40)
Albumin/Globulin Ratio: 1.7 (ref 1.2–2.2)
Albumin: 4 g/dL (ref 3.9–4.9)
Alkaline Phosphatase: 60 IU/L (ref 44–121)
BUN/Creatinine Ratio: 16 (ref 12–28)
BUN: 14 mg/dL (ref 8–27)
Bilirubin Total: 0.5 mg/dL (ref 0.0–1.2)
CO2: 25 mmol/L (ref 20–29)
Calcium: 9.3 mg/dL (ref 8.7–10.3)
Chloride: 104 mmol/L (ref 96–106)
Creatinine, Ser: 0.89 mg/dL (ref 0.57–1.00)
Globulin, Total: 2.4 g/dL (ref 1.5–4.5)
Glucose: 85 mg/dL (ref 70–99)
Potassium: 4.4 mmol/L (ref 3.5–5.2)
Sodium: 143 mmol/L (ref 134–144)
Total Protein: 6.4 g/dL (ref 6.0–8.5)
eGFR: 70 mL/min/{1.73_m2} (ref >59)

## 2022-09-04 LAB — CBC
Hematocrit: 33.4 % — ABNORMAL LOW (ref 34.0–46.6)
Hemoglobin: 11 g/dL — ABNORMAL LOW (ref 11.1–15.9)
MCH: 28.6 pg (ref 26.6–33.0)
MCHC: 32.9 g/dL (ref 31.5–35.7)
MCV: 87 fL (ref 79–97)
Platelets: 262 10*3/uL (ref 150–450)
RBC: 3.84 x10E6/uL (ref 3.77–5.28)
RDW: 13 % (ref 11.7–15.4)
WBC: 5.3 10*3/uL (ref 3.4–10.8)

## 2022-09-04 LAB — NMR, LIPOPROFILE
Cholesterol, Total: 133 mg/dL (ref 100–199)
HDL Particle Number: 30.3 umol/L — ABNORMAL LOW (ref >=30.5)
HDL-C: 68 mg/dL (ref >39)
LDL Particle Number: 363 nmol/L (ref <1000)
LDL Size: 21 nm (ref >20.5)
LDL-C (NIH Calc): 53 mg/dL (ref 0–99)
LP-IR Score: 37 (ref <=45)
Small LDL Particle Number: <90 nmol/L (ref <=527)
Triglycerides: 54 mg/dL (ref 0–149)

## 2022-09-05 ENCOUNTER — Telehealth: Payer: Self-pay | Admitting: Internal Medicine

## 2022-09-05 ENCOUNTER — Ambulatory Visit (HOSPITAL_BASED_OUTPATIENT_CLINIC_OR_DEPARTMENT_OTHER)
Admission: RE | Admit: 2022-09-05 | Discharge: 2022-09-05 | Disposition: A | Discharge status: Home / Self Care | Payer: Medicare Other | Source: Ambulatory Visit | Attending: Internal Medicine | Admitting: Internal Medicine

## 2022-09-05 ENCOUNTER — Telehealth: Payer: Self-pay

## 2022-09-05 DIAGNOSIS — R911 Solitary pulmonary nodule: Secondary | ICD-10-CM | POA: Insufficient documentation

## 2022-09-05 MED ORDER — ROSUVASTATIN CALCIUM 5 MG PO TABS: 2.5000 mg | ORAL_TABLET | Freq: Every day | ORAL | 3 refills | Status: DC

## 2022-09-05 NOTE — Telephone Encounter (Signed)
The patient has been notified of the result and verbalized understanding.  All questions (if any) were answered. Stephani Police, RN 09/05/2022 10:10 AM   Appt changed to May 2024.

## 2022-09-05 NOTE — Telephone Encounter (Signed)
Pateint needs refills on Crestor    Also, I sent a note yesterday to A [facility]n to work on appt for patinet in late may

## 2022-09-05 NOTE — Telephone Encounter (Signed)
-----   Message from [place], MD sent at 09/04/2022  9:04 PM EST ----- Reviewed results with patient   She gave blood 2 wks ago Lipids excellent   She will try going down to 2.5 Crestor   will follow later in year  Pt reports her BP is labile   Not on meds  I have asked her to follow  Keep log   Bring cuff and log to clinic     Please reschedule visit for May/June some time

## 2022-09-09 NOTE — Telephone Encounter (Signed)
Appt made for 11/26/22 and refill sent to her Pharmacy.

## 2022-09-25 ENCOUNTER — Ambulatory Visit: Payer: Medicare Other | Admitting: Internal Medicine

## 2022-09-29 ENCOUNTER — Other Ambulatory Visit: Payer: Self-pay | Admitting: Internal Medicine

## 2022-11-25 NOTE — Progress Notes (Unsigned)
Cardiology Office Note   Date:  11/26/2022   ID:  Pamela Nixon, DOB [birth year], MRN [id number]  PCP:  Pamela Raider, MD  Cardiologist:   Dietrich Pates, MD P  Pt presents for follow up of HTN and CAD   History of Present Illness:  Pamela Nixon is a 72 y.o. female with hx of  CAD (CA score of      and labile BP   I saw the pt in 2023    She has a hx of chest pressure, labile BP and CAD by CT scan (Coronary dz and mild aortic calcifications  Ca score of 53)    I last saw the pt in 2023   Since seen she has done OK   Breathing is OK  No chest pressure  No SOB   Current Meds  Medication Sig   doxylamine, Sleep, (UNISOM) 25 MG tablet Take 25 mg by mouth at bedtime as needed.   [DISCONTINUED] rosuvastatin (CRESTOR) 5 MG tablet Take 0.5 tablets (2.5 mg total) by mouth daily.     Allergies:   Patient has no known allergies.   No past medical history on file.  No past surgical history on file.   Social History:  The patient  reports that she has quit smoking. She has never used smokeless tobacco.   Family History:  The patient's family history is not on file.  Dad:   63 had CABG    Had 2     Paternal dad died of MI at 47    Paternal uncles  not as bad Mom:   BP , afib     Artiritis   93     4 bros  BP meds     2 sisters   BP meds   ROS:  Please see the history of present illness. All other systems are reviewed and  Negative to the above problem except as noted.    PHYSICAL EXAM: VS:  BP (!) 156/70   Pulse (!) 53   Ht 5\' 3"  (1.6 m)   Wt 141 lb 3.2 oz (64 kg)   SpO2 96%   BMI 25.01 kg/m   GEN: Well nourished, well developed, in no acute distress  HEENT: normal  Neck: no JVD, no carotid bruits Cardiac: RRR; no murmur Respiratory:  clear to auscultation  GI: soft, nontender,  No hepatomegaly    EKG:  EKG is ordered today.  Sinus bradycardia 53  bpm   Nonspecific ST changes   Lipid Panel No results found for: "CHOL", "TRIG", "HDL", "CHOLHDL", "VLDL", "LDLCALC",  "LDLDIRECT"    Wt Readings from Last 3 Encounters:  11/26/22 141 lb 3.2 oz (64 kg)  11/21/21 138 lb (62.6 kg)  11/01/21 140 lb (63.5 kg)      ASSESSMENT AND PLAN:  1  CAD   Pt with Ca calcium score that is minimally elevated   Asymptomatic   Rx risk factors     22  HTN   BP is labile but high   She has not taken Maxzide regularly   Recomm 1/2 tab daily    Pt will follow up with BP over several weeks   3  PAD  Patient has very mild atherosclerosis of distal aorta on CT  Rx risk factore s  4  HL    LDL 53  HDL 68  Trig 54  Pt has cut back on statin   WIl check lpids in fall  Current medicines are reviewed at length with the patient today.  The patient does not have concerns regarding medicines.  Signed, Dietrich Pates, MD  11/26/2022 10:12 PM    [facility] [address] [place], [place], [place]  [address] Phone: [phone]; Fax: [phone]

## 2022-11-26 ENCOUNTER — Encounter: Payer: Self-pay | Admitting: Internal Medicine

## 2022-11-26 ENCOUNTER — Ambulatory Visit: Payer: Medicare Other | Attending: Internal Medicine | Admitting: Internal Medicine

## 2022-11-26 VITALS — BP 156/70 | HR 53 | Ht 63.0 in | Wt 141.2 lb

## 2022-11-26 DIAGNOSIS — E782 Mixed hyperlipidemia: Secondary | ICD-10-CM | POA: Diagnosis present

## 2022-11-26 MED ORDER — ROSUVASTATIN CALCIUM 5 MG PO TABS: 2.5000 mg | ORAL_TABLET | Freq: Every day | ORAL | 11 refills | Status: DC

## 2023-01-12 ENCOUNTER — Encounter: Payer: Self-pay | Admitting: Internal Medicine

## 2023-01-13 NOTE — Telephone Encounter (Signed)
Blood pressures look great.    I will call her but I do think she should have a BMET  Call in refill

## 2023-01-14 MED ORDER — TRIAMTERENE-HCTZ 37.5-25 MG PO TABS: ORAL_TABLET | ORAL | 1 refills | Status: DC

## 2023-01-15 MED ORDER — TRIAMTERENE-HCTZ 37.5-25 MG PO TABS: ORAL_TABLET | ORAL | 1 refills | Status: DC

## 2023-01-15 NOTE — Addendum Note (Signed)
Addended by: Bertram Millard on: 01/15/2023 09:59 AM   Modules accepted: Orders

## 2023-02-17 LAB — LAB REPORT - SCANNED: EGFR: 78

## 2023-09-16 IMAGING — MR MR PELVIS WO/W CM
5 of 8 series · 31 of 48 positions shown · IV contrast (multihance)
Comparison: Radiograph dated November 21, 2021; CT examination dated
February 22, 2021

CLINICAL DATA: Pelvic pain, stress fracture suspected.

EXAM:
MRI PELVIS WITHOUT AND WITH CONTRAST
TECHNIQUE: Multiplanar multisequence MR imaging of the pelvis was performed
both before and after administration of intravenous contrast.
CONTRAST:  12mL MULTIHANCE GADOBENATE DIMEGLUMINE 529 MG/ML IV SOLN

[Series 3: T2 fat-sat · sagittal · 4.0mm · 0.47mm/px · 9 of 65 slices shown (1 of 2)]
[im 1/65]
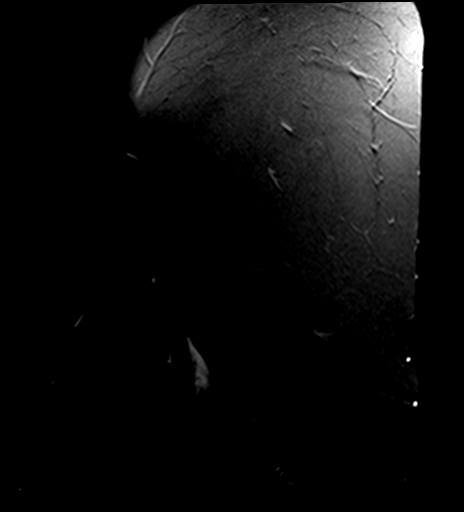
[im 9/65]
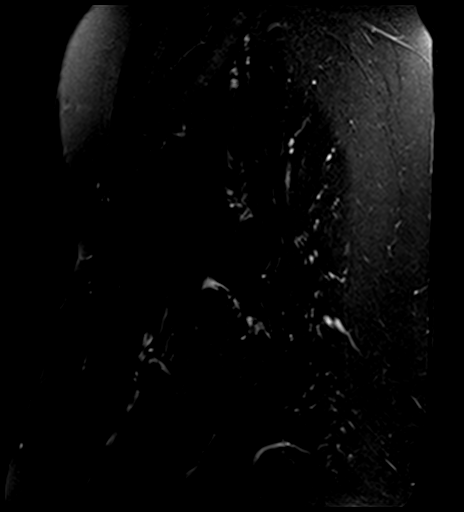
[im 17/65]
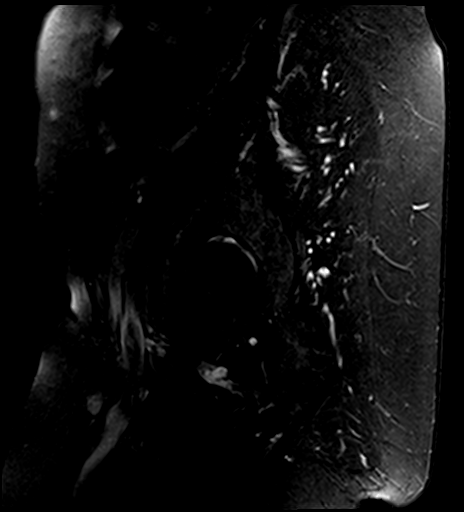
[im 25/65]
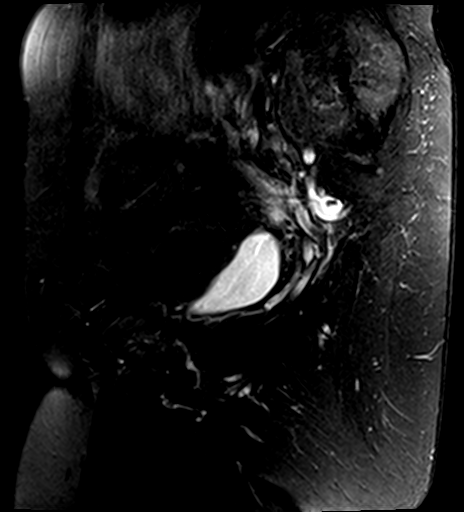
[im 33/65]
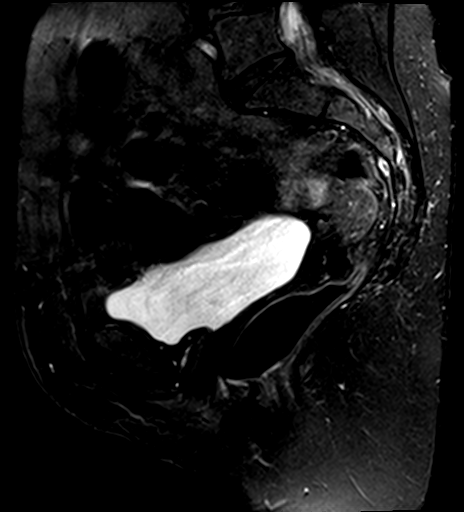
[im 41/65]
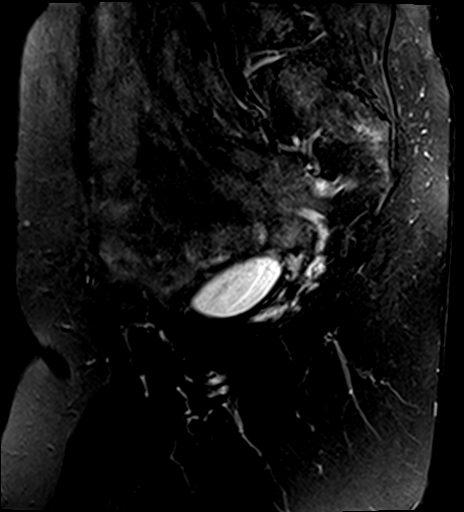
[im 49/65]
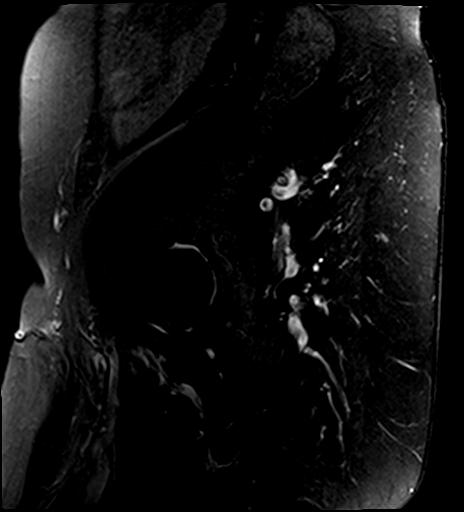
[im 57/65]
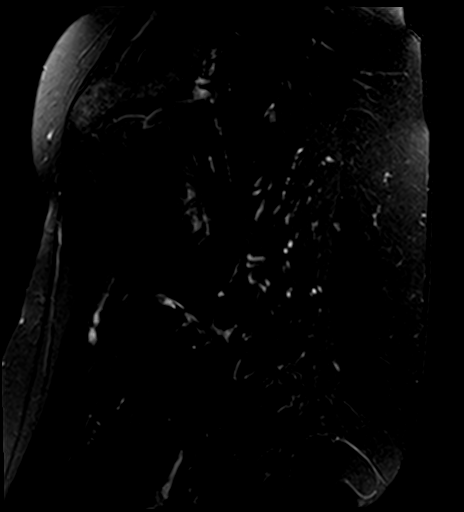
[im 65/65]
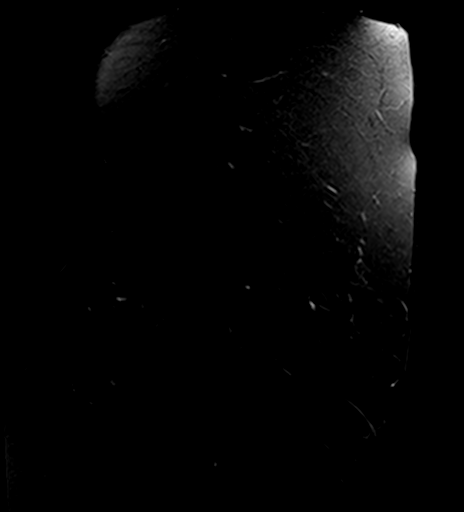

[Series 4: T1 · coronal · 4.0mm · 1.56mm/px · 5 of 36 slices shown (1 of 2)]
[im 1/36]
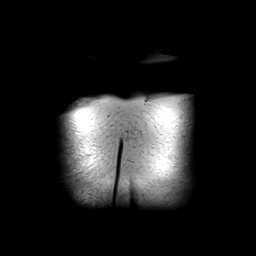
[im 9/36]
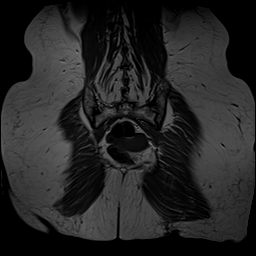
[im 18/36]
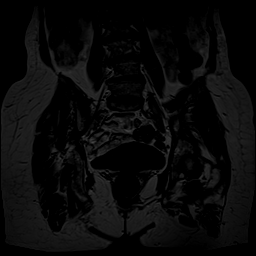
[im 27/36]
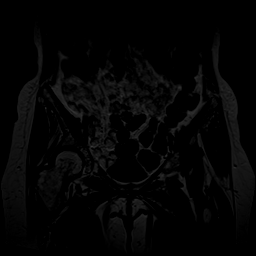
[im 36/36]
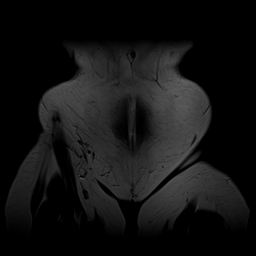

[Series 5: STIR · coronal · 4.0mm · 1.56mm/px · 5 of 36 slices shown]
[im 1/36]
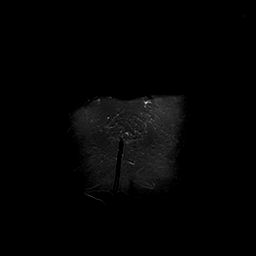
[im 9/36]
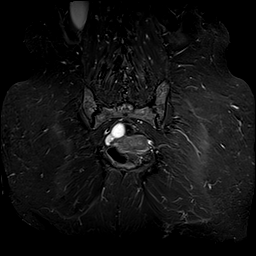
[im 18/36]
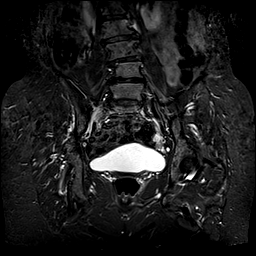
[im 27/36]
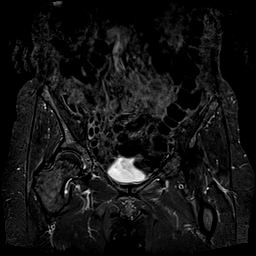
[im 36/36]
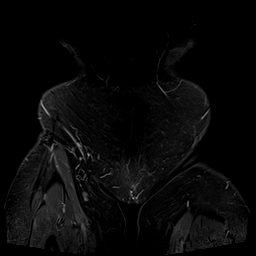

[Series 6: T1 · axial · 4.0mm · 0.59mm/px · z∈[-131,+94]mm · 6 of 46 slices shown (2 of 2)]
[im 1/46]
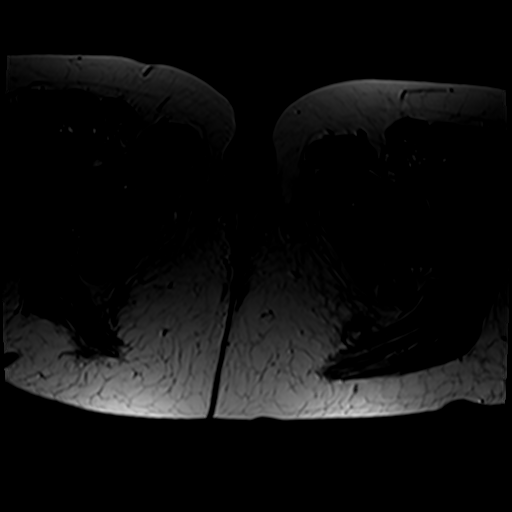
[im 10/46]
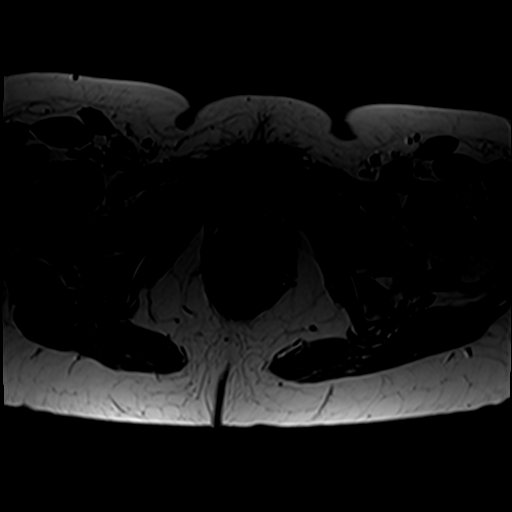
[im 19/46]
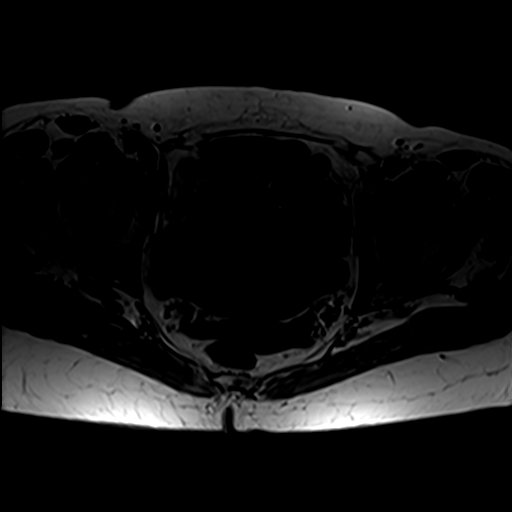
[im 28/46]
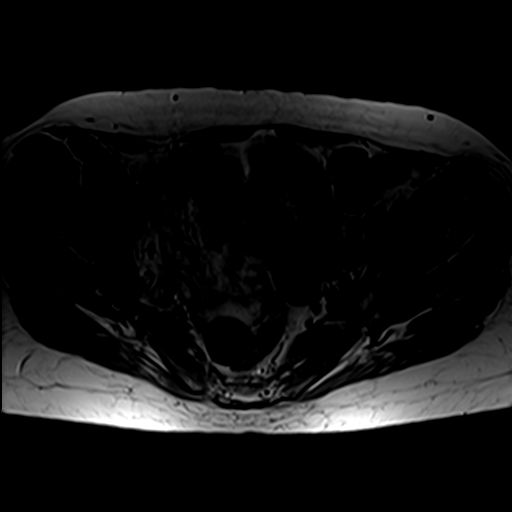
[im 37/46]
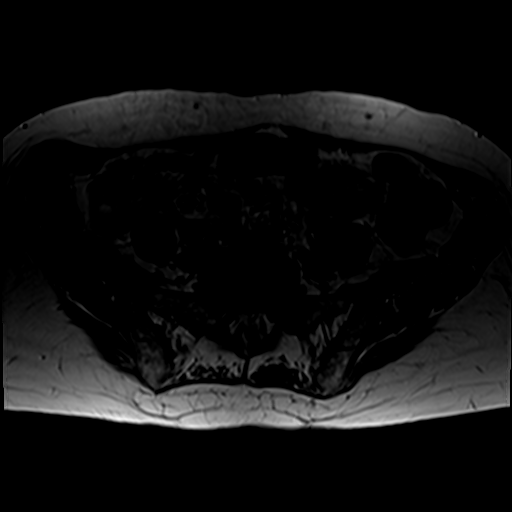
[im 46/46]
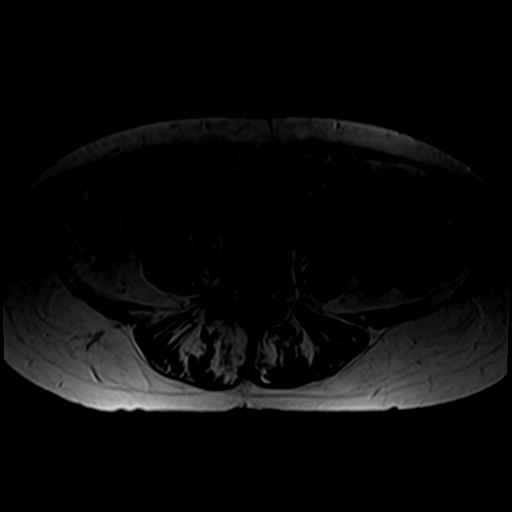

[Series 7: T2 fat-sat · axial · 4.0mm · 1.17mm/px · z∈[-131,+94]mm · 6 of 46 slices shown (2 of 2)]
[im 1/46]
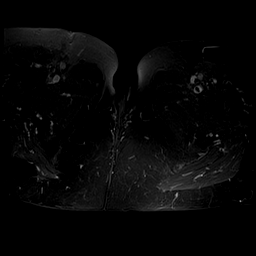
[im 10/46]
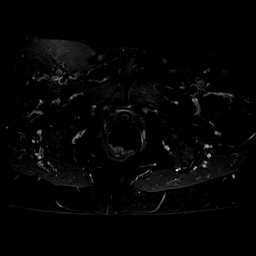
[im 19/46]
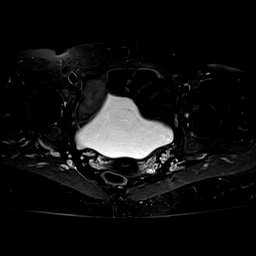
[im 28/46]
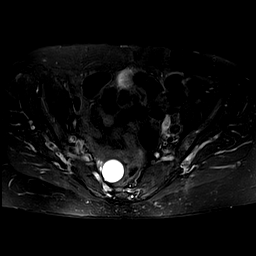
[im 37/46]
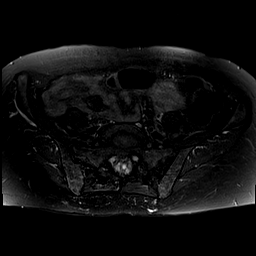
[im 46/46]
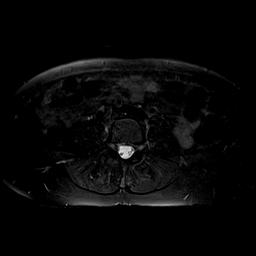

[31 of 48 positions shown; findings below may reference images not displayed]

FINDINGS: Bones:

No hip fracture, dislocation or avascular necrosis.

No periosteal reaction or bone destruction. No aggressive osseous
lesion.

Normal sacrum and sacroiliac joints. No SI joint widening or erosive
changes.

Articular cartilage and labrum

Articular cartilage: Articular cartilage thinning without evidence
of full-thickness defect.

Labrum: Degenerative changes of the labrum. No acute tear, but
evaluation is limited by lack of intraarticular fluid.

Joint or bursal effusion

Joint effusion:  No hip joint effusion.  No SI joint effusion.

Bursae:  No bursa formation.

Muscles and tendons

Flexors: Normal.

Extensors: Normal.

Abductors: Normal.

Adductors: Normal.

Gluteals: Normal.

Hamstrings: Normal.

Other findings

No pelvic free fluid. No fluid collection or hematoma. No inguinal
lymphadenopathy. No inguinal hernia. There is a cystic structure at
the level of S2 vertebral body measuring approximately 2.5 x 2.6 x
2.8 cm, likely adnexal cyst.
IMPRESSION: 1.  No evidence of stress fracture.

2. No evidence of sacroiliitis or appreciable osseous lesion. No
periosteal reaction.

3. Mild bilateral hip osteoarthritis. No evidence of joint effusion.

4.  Muscles and soft tissues of the pelvis are within normal limits.

5. Cystic structure at the level of S2 measuring up to 2.8 cm,
likely adnexal cysts, unchanged.

## 2023-11-13 ENCOUNTER — Other Ambulatory Visit: Payer: Self-pay | Admitting: Internal Medicine

## 2023-12-31 ENCOUNTER — Telehealth: Payer: Self-pay | Admitting: Internal Medicine

## 2023-12-31 ENCOUNTER — Other Ambulatory Visit: Payer: Self-pay | Admitting: Internal Medicine

## 2023-12-31 NOTE — Telephone Encounter (Signed)
 Spoke to pt on telephone BP has been well controlled (110s/120s) but she is urinating more at night   QUestion if related to maxzide   It is a very low dose  I am not convinced but it would be OK to try another agent    Recomm trial of amlodipine 2.5 mg    Call in Rx for 1 month with 12 refills to pharmacy  Pt to see PCP soon I have asked her to have labs forwarded to me I have also asked that she contact me (phone, mychart ) with BP responses

## 2024-01-01 MED ORDER — AMLODIPINE BESYLATE 2.5 MG PO TABS: 2.5000 mg | ORAL_TABLET | Freq: Every day | ORAL | 12 refills | Status: AC

## 2024-01-01 NOTE — Addendum Note (Signed)
 Addended by: Alanna Alley on: 01/01/2024 09:15 AM   Modules accepted: Orders

## 2024-01-01 NOTE — Telephone Encounter (Signed)
 New RX sent in to the Pharmacy.

## 2024-01-18 ENCOUNTER — Other Ambulatory Visit: Payer: Self-pay | Admitting: Internal Medicine

## 2024-01-21 ENCOUNTER — Other Ambulatory Visit: Payer: Self-pay | Admitting: Internal Medicine

## 2024-02-20 LAB — LAB REPORT - SCANNED
A1c: 5.9
EGFR: 83

## 2024-02-22 ENCOUNTER — Other Ambulatory Visit: Payer: Self-pay | Admitting: Internal Medicine

## 2024-02-23 ENCOUNTER — Ambulatory Visit: Payer: Self-pay | Admitting: Internal Medicine
